# Patient Record
Sex: Male | Born: 1985 | Race: White | Hispanic: No | Marital: Single | Smoking: Never smoker
Health system: Western US, Academic
[De-identification: ages and names within clinical notes are randomized; demographics above are authoritative.]

## PROBLEM LIST (undated history)

## (undated) DIAGNOSIS — C9140 Hairy cell leukemia not having achieved remission: Secondary | ICD-10-CM

## (undated) HISTORY — DX: Hairy cell leukemia not having achieved remission (CMS-HCC): C91.40

---

## 2012-09-29 DIAGNOSIS — M62838 Other muscle spasm: Secondary | ICD-10-CM

## 2012-09-29 DIAGNOSIS — M7918 Myalgia, other site: Secondary | ICD-10-CM

## 2012-09-29 DIAGNOSIS — M545 Low back pain: Secondary | ICD-10-CM

## 2014-02-10 ENCOUNTER — Telehealth (HOSPITAL_BASED_OUTPATIENT_CLINIC_OR_DEPARTMENT_OTHER): Payer: Self-pay

## 2014-02-10 NOTE — Telephone Encounter (Addendum)
Elijah Davis's would like a consult with Dr.Jamieson. Patient to fax his current medical records to [phone].     91 pages of medical records received on 11.30.15 from [name]. Diagnosis of Refractory hairy cell leukemia. Records are from [facility]  [address]  [address] [phone]. All so records are from [place] [address], [phone] Fax [phone]. Records placed for scanning. Please note Elijah Davis's is requesting a consult with Dr.Jamieson.

## 2014-02-23 ENCOUNTER — Telehealth (HOSPITAL_BASED_OUTPATIENT_CLINIC_OR_DEPARTMENT_OTHER): Payer: Self-pay | Admitting: Nurse Practitioner

## 2014-02-23 NOTE — Telephone Encounter (Signed)
Elijah Davis's calling in inquering on the status of his case being reviewed. I let him know his case was reviewed, Dr.Jamieson did review his case however she does not see his dx. I advised Elijah Davis's that I will have the triage M.D N.P re look at his records, and phone him back.       91 pages of medical records received on 11.30.15 from Elijah Davis's. Diagnosis of Refractory hairy cell leukemia. Records are from [facility] [address] [address] [phone]. All so records are from [facility] [place] [address], [phone] Fax [phone]. Records have been scanned in.

## 2014-02-26 ENCOUNTER — Telehealth (HOSPITAL_BASED_OUTPATIENT_CLINIC_OR_DEPARTMENT_OTHER): Payer: Self-pay | Admitting: Internal Medicine

## 2014-02-26 NOTE — Telephone Encounter (Signed)
Mr. Elijah Davis calling in inquiring on the status of his case being reviewed. I let him know his case was reviewed, Dr. Oretha Ellis did review his case however she does not see his dx. I advised Elijah Davis's that I will have the triage M.D N.P re look at his records, and phone him back.       91 pages of medical records received on 11.30.15 from Elijah Davis. Diagnosis of Refractory hairy cell leukemia. Records are from [facility] [address], [place] [address] [phone]. All so records are from [facility] [place] [address], [phone] Fax [phone]. Records have been scanned in.

## 2014-03-01 ENCOUNTER — Telehealth (HOSPITAL_BASED_OUTPATIENT_CLINIC_OR_DEPARTMENT_OTHER): Payer: Self-pay

## 2014-03-01 NOTE — Telephone Encounter (Signed)
Adela Glimpse, MD [phone])    Sent: Sat February 27, 2014 5:04 PM     To: Alois Cliche; Tennis Ship, MD; Meredith Staggers, MD; Osa Craver, MD; Burnetta Sabin           Message      This patient is most appropriate for Iona Beard or possibly Bjorn Loser.     ----- Message -----     From: Alois Cliche     Sent: 02/26/2014 2:27 PM     To: Alois Cliche, Osa Craver, MD, *

## 2014-03-01 NOTE — Telephone Encounter (Signed)
Please review records in media for BMT.

## 2014-03-01 NOTE — Telephone Encounter (Signed)
Per heme physician - Dr. Tennis Ship - "   Refractory Hairy Cell can also be seen semi-urgently by BMT      1. Routed to BMT AA: - Kristeen Miss for new patient scheduling.

## 2014-03-01 NOTE — Telephone Encounter (Signed)
Phone call made to patient who recently moved to the area and needs to follow up with oncology services. Dx is Refractory hairy cell leukemia. Appt is scheduled for tomorrow 12/15 at 11AM. Medical records have already been received and scanned into media. NP packet has been sent to email address listed.

## 2014-03-02 ENCOUNTER — Ambulatory Visit: Payer: Managed Care, Other (non HMO) | Attending: Internal Medicine | Admitting: Internal Medicine

## 2014-03-02 ENCOUNTER — Encounter: Payer: Self-pay | Admitting: Hospital

## 2014-03-02 ENCOUNTER — Other Ambulatory Visit: Payer: Managed Care, Other (non HMO) | Attending: Hematology

## 2014-03-02 ENCOUNTER — Encounter (HOSPITAL_BASED_OUTPATIENT_CLINIC_OR_DEPARTMENT_OTHER): Payer: Self-pay | Admitting: Internal Medicine

## 2014-03-02 ENCOUNTER — Telehealth (HOSPITAL_BASED_OUTPATIENT_CLINIC_OR_DEPARTMENT_OTHER): Payer: Self-pay

## 2014-03-02 VITALS — BP 131/85 | HR 86 | Temp 98.5°F | Resp 17 | Ht 70.0 in | Wt 182.1 lb

## 2014-03-02 DIAGNOSIS — C9140 Hairy cell leukemia not having achieved remission: Principal | ICD-10-CM | POA: Insufficient documentation

## 2014-03-02 LAB — COMPREHENSIVE METABOLIC PANEL, BLOOD
ALT (SGPT): 25 U/L (ref 0–41)
AST (SGOT): 22 U/L (ref 0–40)
Albumin: 4.6 g/dL (ref 3.5–5.2)
Alkaline Phos: 66 U/L (ref 40–129)
Anion Gap: 12 mmol/L (ref 7–15)
BUN: 13 mg/dL (ref 6–20)
Bicarbonate: 29 mmol/L (ref 22–29)
Bilirubin, Tot: 0.24 mg/dL (ref <1.20)
Calcium: 10.3 mg/dL (ref 8.5–10.6)
Chloride: 101 mmol/L (ref 98–107)
Creatinine: 1.05 mg/dL (ref 0.67–1.17)
GFR: >60 mL/min
Glucose: 100 mg/dL (ref 70–115)
Potassium: 4.6 mmol/L (ref 3.5–5.1)
Sodium: 142 mmol/L (ref 136–145)
Total Protein: 7.1 g/dL (ref 6.0–8.0)

## 2014-03-02 LAB — CBC WITH DIFF, BLOOD
ANC-Automated: 3.8 10*3/uL (ref 1.6–7.0)
ANC-Instrument: 3.8 10*3/uL (ref 1.6–7.0)
Abs Eosinophils: 0.1 10*3/uL (ref 0.1–0.7)
Abs Lymphs: 1 10*3/uL (ref 0.8–3.1)
Abs Monos: 0.5 10*3/uL (ref 0.2–0.8)
Eosinophils: 2 % (ref 1–4)
Hct: 48.4 % (ref 40.0–50.0)
Hgb: 16.4 gm/dL (ref 13.7–17.5)
Lymphocytes: 19 % (ref 19–53)
MCH: 29.8 pg (ref 26.0–32.0)
MCHC: 33.9 % (ref 32.0–36.0)
MCV: 88 um3 (ref 79.0–95.0)
MPV: 10.8 fL (ref 9.4–12.4)
Monocytes: 9 % (ref 5–12)
Plt Count: 133 10*3/uL — ABNORMAL LOW (ref 140–370)
RBC: 5.5 10*6/uL (ref 4.60–6.10)
RDW: 13.8 % (ref 12.0–14.0)
Segs: 70 % (ref 34–71)
WBC: 5.4 10*3/uL (ref 4.0–10.0)

## 2014-03-02 MED ORDER — BUPROPION XL (DAILY) 300 MG OR TB24
150.00 mg | ORAL_TABLET | Freq: Every day | ORAL | Status: DC
Start: ? — End: 2015-12-09

## 2014-03-02 MED ORDER — NALTREXONE HCL 50 MG OR TABS
50.00 mg | ORAL_TABLET | Freq: Every day | ORAL | Status: DC
Start: ? — End: 2014-06-29

## 2014-03-02 MED ORDER — TRAZODONE HCL 100 MG OR TABS
100.00 mg | ORAL_TABLET | ORAL | Status: DC | PRN
Start: ? — End: 2015-12-09

## 2014-03-02 MED ORDER — QUETIAPINE FUMARATE 100 MG OR TABS
50.00 mg | ORAL_TABLET | Freq: Every day | ORAL | Status: DC
Start: ? — End: 2015-12-09

## 2014-03-02 NOTE — Telephone Encounter (Addendum)
Faxed request for Bone Marrow Biopsy slides (Case #[id number]) from 05/16/2012 to [employer] Fax [phone]

## 2014-03-02 NOTE — Progress Notes (Signed)
[facility]    INITIAL VISIT NOTE    Patient's name: Elijah Davis  Date of Birth: 1985-05-09  Address: [address]  [place] [address]  Phone: [phone] (home)     Date of visit: 03/02/2014    Reason for visit: Hairy Cell Leukemia    Elijah Davis is a 28 year old male with Hairy Cell Leukemia who is here to establish care.    The patient first noted the onset of acne like pustular facial skin lesions and fatigue while working in [place] in 2013. He then moved to [place] in early 2014 when he experienced fatigue, abdominal bloating and discomfort, weight loss, night sweats and bony pain. On 05/15/2012 he underwent an abdominal U/S that showed splenomegaly at 29 cm, mild hepatomegaly at 14.2 cm and nodules in the region of porta hepatis.. On 05/16/2012 his CBC showed WBC 2.1, H/H 7.4/22.8, MCV 101, Plt 23K, ANC 0.1, ALC 1.9. A CT of the C/A/P performed the same day showed massive splenomegaly measuring 33.6 cm. , confluent LAD in the gastrohepatic and hepatoduodenal ligaments measuring 4.5 x 7.5 cm and encasing the hepatic artery and protal vein and enlarged lymph nodes in the subcarinal space and the retroperitoneum measuring up to 1.4 cm. On 2/28 he underwent a bone marrow biopsy interpreted by [facility] that showed a hypercellular (95%) bone marrow demonstrating extensive 90% replacement by hairy cell leukemia with consequent reduction in trilineage hematopoesis. Flow cytometry from bone marrow aspirate shows CD11c positive B cell leukemia/lymphoma, 26% lambda restricted. The clonal B cells coexpressed CD103, CD25 and CD11c.     He was initially treated with Cladribine for 7 days. At that time he was also started on escalating doses of opioids for abdominal and bony pain. He felt better after the treatment and his abdominal pain and distention improved and he was able to go back to work. He continued to have bony pain. An U/S performed on 06/13/12 showed decrease in the size of  the spleen to 22 cm. A repeat bone marrow biopsy in the summer of 2014 showed by report partial response with 10% leukemic cells.    The patient quit his job and moved back to [facility] where his parents resided in October of 2014. There he established care with Dr. Pollie Meyer. At that time he had worsening bone pain and fatigue and was found to be pancytopenic (1.7> 15.6/45.5 <87) A bone marrow biopsy on 02/02/2013 showed a hypocellular marrrow (10-30%) involved by HCL at ~60%. He was then treated with rituximab x 8 from 02/16/2013 to 04/07/2012. On 05/29/2013 his CBC was 2.1>15.7/45.4<11 and he underwent a bone marrow biopsy that showed involvement by residual HCL that was now CD20 negative. Cytogenetics were normal. Apparently BRAF mutation(s) was detected by report although I do not have the original documentation.    On 06/26/2013 the patient was started on dabrafenib 177m bid. His CBC at that time was : 1.5>15.1<100 and his constitutional symptoms were getting worse. Within weeks of starting dabrafetinib the patient noted marked symptomatic improvement. On 08/13/2013 his CBC was 4.1>16.6<110. The only side effect that he noticed was joint pains that resolved after he discontinued the drug for 2-3 days.    On 10/26/2013 [facility] On 12/11/2013 he underwent a bone marrow biopsy that showed residual involvement by hairy cell leukemia (10-15% of marrow cellularity by IHC and 0.23% of all white cells by flow).  The cells were positive for CD20 (bright) and CD19 (bright), CD11c, CD25, CD103 and CD200 and negative ofr CD5, CD10 or CD38. BRAF exon 15 mutation analysis was negative for mutation. Dr Odis Hollingshead recommended at that time that he stops the dabrafenib with close follow up as the length of treatment has not been established, the long term side effects are unknown but there is definitely an increased incidence in non-melanoma skin cancers in patient treated with  BRAF inhibitors.     The patient moved to [facility] a few weeks ago to undergo rehab for his opioid addiction. He has now successfully completed that and feels really well. He has been exercising and his mood is normal. His skin lesions have resolved. He denies back pain, bone pain, abdominal distention or early satiety. He wants to look for a job in the [facility] area. He has now been off treatment for about a month and a half.    The patient denies fevers, chills, night sweats, sore throat, blurry vision, difficulty breathing, chest pain, abdominal pain, nausea, vomiting, diarrhea, dysuria, bleeding or easy bruising, neuropathy, depression or anxiety.  The rest of a comprehensive review of systems is negative.    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  He smoked e-cigarettes in the past for a short time, not now.  He doesn't drink alcohol  He had used marijuana and cocaine in the past.    Family History:  His parents are alive and well. He has 3 younger siblings (2 male and 1 male) who are A&W. He had a paternal aunt who died of breast [place] and his maternal granmother died of a brain tumor    Medications:    buPROPion (WELLBUTRIN) 300 MG Extended-Release tablet Take 300 mg by mouth daily.   naltrexone (DEPADE) 50 MG tablet Take 50 mg by mouth daily.   QUEtiapine (SEROQUEL) 100 MG tablet Take 100 mg by mouth 2 times daily.   traZODone (DESYREL) 100 MG tablet Take 100 mg by mouth nightly.       Allergies:  Review of patient's allergies indicates no known allergies.    Vital Signs:  BP 131/85 mmHg   Pulse 86   Temp(Src) 98.5 F (36.9 C) (Oral)   Resp 17   Ht $R'5\' 10"'nd$  (1.778 m)   Wt 82.6 kg (182 lb 1.6 oz)   BMI 26.13 kg/m2   SpO2 98%    Physical Exam:  General Impression: NAD, KPS is 100%  HEENT: PERRL, OP is moist and clear  Neck is supple with no palpable thyromegaly  Lymph nodes: There is no palpable lower cervical,  supraclavicular, axillary or groin LAD bilaterally  Lungs: clear to auscultation and percussion bilaterally  Heart: RRR, no m/r/g  Abdomen: Soft, NT, ND without palpable masses or hepatosplenomegaly  Ext: No LE edema  Neuro: A&Ox3, no gross abnormalities  Skin: no rashes. There is some scarring from his old rash on the R side of the face.    Labs:    Lab Results   Component Value Date    WBC 5.4 03/02/2014    RBC 5.50 03/02/2014    HGB 16.4 03/02/2014    HCT 48.4 03/02/2014    MCV 88.0 03/02/2014    MCHC 33.9 03/02/2014    RDW 13.8 03/02/2014    PLT 133 03/02/2014    MPV 10.8 03/02/2014    SEG 70  03/02/2014    LYMPHS 19 03/02/2014    MONOS 9 03/02/2014    EOS 2 03/02/2014       Assessment and Plan:    In summary Mr. Hersh is a 28 yo man with history of refractory hairy cell leukemia s/p treatment with cladribine, rituximab and dabrafenib. As noted by Dr. [facility]dis Holl[facility]head at [facility] there were a few atypical features about his presentation and history and namely his young age at diagnosis, the presence of bulky abdominal LAD and of a skin rash and his refractoriness to treatment. Off note the test for the BRAF mutation at his last marrow was negative, so one wonders what the mechanism for his impressive response to treatment was.     He now feels and looks completely normal and his counts have normalized with an exception of a very mild trhombocytopenia that is not much different compared to his last CBC in September as their lower limit of normal is 160. I would also agree with Dr. Era Skeen recommendation to continue holding the dabrafenib in the absence of an indication for treatment even though his last marrow showed still involvement by residual HCL. This recommendation was based on the fact that there is no data regarding prolonged treatment with BRAF inhibitors and there are definitely some serious concerns for their long term safety. In addition all the data in HCL is with vemurafenib rather than dabrafenib. Therefore  we will carefully monitor his disease with montly CBCs and flow cytometry of the peripheral blood periodically. If his counts were to drop or if he developed symptoms, we will need to proceed with a bone marrow biopsy but I do not see an indication presently. Rhylan is in complete agreement with this plan and knows to call if he develops new symptoms or has any concerns. He will return in one month for a scheduled visit. We also talked at length about the prognosis, pathogenesis and treatment of hairy cell leukemia, issues that he knows and understands really well.

## 2014-03-04 LAB — B CELL LYMPHOMA FLOW CYTOMETRY PANEL

## 2014-03-04 NOTE — Telephone Encounter (Signed)
Received slides from [facility] and forwarded to Pathology at [facility].

## 2014-03-05 ENCOUNTER — Other Ambulatory Visit
Admit: 2014-03-05 | Discharge: 2014-03-05 | Disposition: A | Discharge status: Home / Self Care | Payer: Managed Care, Other (non HMO) | Attending: Internal Medicine | Admitting: Internal Medicine

## 2014-03-05 DIAGNOSIS — C9140 Hairy cell leukemia not having achieved remission: Secondary | ICD-10-CM

## 2014-03-09 ENCOUNTER — Ambulatory Visit (HOSPITAL_BASED_OUTPATIENT_CLINIC_OR_DEPARTMENT_OTHER): Payer: Managed Care, Other (non HMO) | Admitting: Internal Medicine

## 2014-03-09 NOTE — Procedures (Signed)
OUTSIDE SLIDE REVIEW  Received from [facility], [place] [address]  "[id number]" collected on 05/16/2012  DIAGNOSIS:    BONE MARROW:  - Hairy cell leukemia involvement of a hypercellular marrow (see  interpretation)           - Moderately increased reticulin fibrosis  PERIPHERAL BLOOD (per CBC only):  - Pancytopenia    INTERPRETATION:  The hypercellular marrow contains small, mature leukemia/ lymphoma cells  that occupy over  90% of the total cellularity.  These leukemia/ lymphoma  cells have morphology consistent with hairy cell leukemia on the aspirate  smear.  Flow cytometry reveals these lymphoma/ leukemia cells have an  immunophenotype diagnostic of hairy cell leukemia (CD11c+, CD25+, CD103+).  The lymphoma/ leukemia cells also stain for Annexin A1, considered a  specific stain for hairy cell leukemia.    CLINICAL DIAGNOSIS:  28 y/o male.  HEMOGRAM (dated 05/15/2012):  WBC 2.1K/Cu.mm, RBC 2.77M/Cu.mm., HGB 7.4gm/dL, HCT 22.6%, MCV 101.FL, RDW  22.8%, and PLT 23.0K/Cu.mm. Absolute count: neutrophils 100/Cu.mm,  lymphocytes 1900/Cu.mm, monocytes 100/Cu.mm.    PERIPHERAL BLOOD SMEAR:  Not available.  ASPIRATE SMEAR:  Aspirate smears contain no spicules, thus insufficient material for review.  However, mononuclear cells with "hairy" cytoplasmic projections are seen  intermixed with red blood cells in the background.    TOUCH PREP:  Not available.    BIOPSY:  Sections of the bone marrow biopsy, which are adequate for evaluation, show  a 95% cellular marrow, hypercellular for age. Trilineage hematopoiesis is  markedly reduced due to marked interstitial infiltration of atypical  lymphoid cells. The atypical lymphoid cells show abundant cytoplasm and  oval nuclei with a "fried egg" appearance. Megakaryocytes are decreased  with normal morphology. No increase in blasts is identified.    CLOT SECTION:  Not available.  IRON STAIN:  Per outside report, iron stores are not identified in both  aspirate and  biopsy section. Of note, the aspirate is aspicular.    SPECIAL STAINS:  Kindly provided immunohistochemical stains were performed on the biopsy  section. The atypical lymphoid infiltrates are composed of CD20/PAX5  positive B-cells which co-express CD25, CD123 (small subset), Annexin A1  (dim), comprising 90% of the total cellularity. CD3 highlights scattered  T-cells in the background. Per outside report, CD34, CD117, and E-cadherin  highlights blasts, constituting less than 5% of the total cellularity.  CD117 also highlights scattered mast cells. CD138 highlights scattered  plasma cells. CD61 highlights a few megakaryocytes. CD71 highlights  decreased erythroid precursors.  Reticulin stain show moderate fibrosis.  FLOW CYTOMETRY ASSESSMENT:  Please refer to outside report.  FLOW CYTOMETRY RESULTS:  Per outside report, immunophenotypic analysis reveals CD11c-positive B-cell  leukemia/lymphoma, 26% lambda-restricted B-cells.  Please refer to outside report for further details.  CYTOGENETICS:  Per outside report, cytogenetic analysis shows no dividing cells.  Please refer to outside report for further details.    SPECIMEN(S) SUBMITTED:  Received from [employer], [place] [address] are 9 slides labeled  "[id number]" collected on 05/16/2012.  CONFIDENTIAL HEALTH INFORMATION: Health Care information is personal and  sensitive information. If it is being faxed to you it is done so under  appropriate authorization from the patient or under circumstances that do  not require patient authorization. You, the recipient, are obligated to  maintain it in a safe, secure and confidential manner. Re-disclosure  without additional patient consent or as permitted by law is prohibited.  Unauthorized re-disclosure or failure to maintain confidentiality could  subject you to penalties  described in federal and state law.  If you have  received this report or facsimile in error, please notify the [facility]  Department immediately and destroy the received document(s).    Material reviewed and Interpreted and  Report Electronically Signed by:  Lura Em M.D. [phone])  Attending Pathologist  03/09/14 14:49  Electronic Signature derived from a single  controlled access password

## 2014-03-30 ENCOUNTER — Other Ambulatory Visit: Payer: Managed Care, Other (non HMO) | Attending: Internal Medicine

## 2014-03-30 ENCOUNTER — Encounter (HOSPITAL_BASED_OUTPATIENT_CLINIC_OR_DEPARTMENT_OTHER): Payer: Self-pay | Admitting: Internal Medicine

## 2014-03-30 ENCOUNTER — Ambulatory Visit: Payer: Managed Care, Other (non HMO) | Attending: Internal Medicine | Admitting: Internal Medicine

## 2014-03-30 VITALS — BP 133/69 | HR 75 | Temp 98.5°F | Resp 16 | Ht 70.0 in | Wt 185.6 lb

## 2014-03-30 DIAGNOSIS — C9140 Hairy cell leukemia not having achieved remission: Principal | ICD-10-CM | POA: Insufficient documentation

## 2014-03-30 LAB — CBC WITH DIFF, BLOOD
ANC-Automated: 5.4 10*3/uL (ref 1.6–7.0)
ANC-Instrument: 5.4 10*3/uL (ref 1.6–7.0)
Abs Eosinophils: 0.1 10*3/uL (ref 0.1–0.7)
Abs Lymphs: 1.6 10*3/uL (ref 0.8–3.1)
Abs Monos: 0.4 10*3/uL (ref 0.2–0.8)
Eosinophils: 1 % (ref 1–4)
Hct: 48.7 % (ref 40.0–50.0)
Hgb: 16.6 gm/dL (ref 13.7–17.5)
Lymphocytes: 21 % (ref 19–53)
MCH: 29.8 pg (ref 26.0–32.0)
MCHC: 34.1 % (ref 32.0–36.0)
MCV: 87.4 um3 (ref 79.0–95.0)
MPV: 11.1 fL (ref 9.4–12.4)
Monocytes: 6 % (ref 5–12)
Plt Count: 146 10*3/uL (ref 140–370)
RBC: 5.57 10*6/uL (ref 4.60–6.10)
RDW: 13.2 % (ref 12.0–14.0)
Segs: 72 % — ABNORMAL HIGH (ref 34–71)
WBC: 7.5 10*3/uL (ref 4.0–10.0)

## 2014-03-30 LAB — COMPREHENSIVE METABOLIC PANEL, BLOOD
ALT (SGPT): 32 U/L (ref 0–41)
AST (SGOT): 44 U/L — ABNORMAL HIGH (ref 0–40)
Albumin: 4.8 g/dL (ref 3.5–5.2)
Alkaline Phos: 62 U/L (ref 40–129)
Anion Gap: 14 mmol/L (ref 7–15)
BUN: 22 mg/dL — ABNORMAL HIGH (ref 6–20)
Bicarbonate: 27 mmol/L (ref 22–29)
Bilirubin, Tot: 0.31 mg/dL (ref <1.20)
Calcium: 9.9 mg/dL (ref 8.5–10.6)
Chloride: 100 mmol/L (ref 98–107)
Creatinine: 1.15 mg/dL (ref 0.67–1.17)
GFR: >60 mL/min
Glucose: 101 mg/dL (ref 70–115)
Potassium: 5.1 mmol/L (ref 3.5–5.1)
Sodium: 141 mmol/L (ref 136–145)
Total Protein: 7.3 g/dL (ref 6.0–8.0)

## 2014-03-30 NOTE — Progress Notes (Signed)
[facility]    Patient's name: Elijah Davis  Date of Birth: 12/29/1985    Date of visit: 03/30/2014    Reason for visit: Scheduled follow up for Hairy Cell Leukemia    Elijah Davis is a 28 year old male with HCL currently in hematologic remission and off treatment who returns today for a scheduled follow up visit. For details of his initial presentation and prior treatment please refer to my note from 03/02/14. He continues to feel well. He's exercising. He has no complaints.    The patient denies fevers, chills, night sweats, sore throat, blurry vision, difficulty breathing, chest pain, abdominal pain, nausea, vomiting, diarrhea, dysuria, bleeding or easy bruising, neuropathy, depression or anxiety.  The rest of a comprehensive review of systems is negative.    Past Medical History:  He  has a past medical history of Hairy cell leukemia.    Social History:  He  reports that he has never smoked. He does not have any smokeless tobacco history on file. He reports that he does not drink alcohol or use illicit drugs.    Medications:    buPROPion (WELLBUTRIN) 300 MG Extended-Release tablet Take 300 mg by mouth daily.   naltrexone (DEPADE) 50 MG tablet Take 50 mg by mouth daily.   QUEtiapine (SEROQUEL) 100 MG tablet Take 100 mg by mouth 2 times daily.   traZODone (DESYREL) 100 MG tablet Take 100 mg by mouth nightly.         Allergies:  Review of patient's allergies indicates no known allergies.    Vital Signs:  BP 133/69 mmHg   Pulse 75   Temp(Src) 98.5 °F (36.9 °C) (Oral)   Resp 16   Ht 5' 10" (1.778 m)   Wt 84.171 kg (185 lb 9 oz)   BMI 26.63 kg/m2   SpO2 98%      Physical Exam:  General Impression: NAD, KPS is 100%  HEENT: PERRL, OP is moist and clear  Lymph nodes: There is no palpable lower cervical, supraclavicular, axillary or groin LAD bilaterally  Lungs: clear to auscultation and percussion bilaterally  Heart: RRR, no m/r/g  Abdomen: Soft, NT, ND without palpable masses or  hepatosplenomegaly  Ext: No LE edema  Neuro: A&Ox3, no gross abnormalities    Labs:  Results for orders placed or performed in visit on 03/30/14   CBC WITH ADIFF, BLOOD   Result Value Ref Range    WBC 7.5 4.0 - 10.0 1000/mm3    RBC 5.57 4.60 - 6.10 mill/mm3    Hgb 16.6 13.7 - 17.5 gm/dL    Hct 48.7 40.0 - 50.0 %    MCV 87.4 79.0 - 95.0 um3    MCH 29.8 26.0 - 32.0 pgm    MCHC 34.1 32.0 - 36.0 %    RDW 13.2 12.0 - 14.0 %    MPV 11.1 9.4 - 12.4 fL    Plt Count 146 140 - 370 1000/mm3    ANC-Instrument 5.4 1.6 - 7.0 1000/mm3    Segs 72 (H) 34 - 71 %    Lymphocytes 21 19 - 53 %    Monocytes 6 5 - 12 %    Eosinophils 1 <1-4 %    ANC-Automated 5.4 1.6 - 7.0 1000/mm3    Abs Lymphs 1.6 0.8 - 3.1 1000/mm3    Abs Monos 0.4 0.2 - 0.8 1000/mm3    Abs Eosinophils 0.1 <0.1-0.7 1000/mm3    Diff Type Automated        Flow cytometry of the PB on 03/02/14:  There is no evidence of hairy cell leukemia in the peripheral blood. Clinical correlation is advised.     Assessment and Plan:    In summary Elijah Davis is a 29 yo man with history of relapsed refractory Hairy Cell Leukemia ost recently treated with the BRAF inhibitor dabrafenib which was stopped in October. He continues to do really well clinically and his CBC from today and from last month are consistent with hematologic remission. His peripheral blood had no detectable hairy cells by flow. He was cleared by me to start looking for a job which is something that he wants to do. He will return for a follow up in 2-3 months but was instructed to call me in the interim with any new problems or concerns.

## 2014-06-28 ENCOUNTER — Encounter (HOSPITAL_BASED_OUTPATIENT_CLINIC_OR_DEPARTMENT_OTHER): Payer: Self-pay

## 2014-06-28 DIAGNOSIS — C9140 Hairy cell leukemia not having achieved remission: Principal | ICD-10-CM

## 2014-06-29 ENCOUNTER — Ambulatory Visit: Payer: Managed Care, Other (non HMO) | Attending: Internal Medicine | Admitting: Internal Medicine

## 2014-06-29 ENCOUNTER — Other Ambulatory Visit: Payer: Managed Care, Other (non HMO) | Attending: Internal Medicine

## 2014-06-29 VITALS — BP 122/69 | HR 71 | Temp 98.8°F | Resp 16 | Ht 70.0 in | Wt 180.9 lb

## 2014-06-29 DIAGNOSIS — C9140 Hairy cell leukemia not having achieved remission: Principal | ICD-10-CM | POA: Insufficient documentation

## 2014-06-29 DIAGNOSIS — D696 Thrombocytopenia, unspecified: Secondary | ICD-10-CM | POA: Insufficient documentation

## 2014-06-29 LAB — COMPREHENSIVE METABOLIC PANEL, BLOOD
ALT (SGPT): 28 U/L (ref 0–41)
AST (SGOT): 26 U/L (ref 0–40)
Albumin: 4.7 g/dL (ref 3.5–5.2)
Alkaline Phos: 71 U/L (ref 40–129)
Anion Gap: 15 mmol/L (ref 7–15)
BUN: 17 mg/dL (ref 6–20)
Bicarbonate: 25 mmol/L (ref 22–29)
Bilirubin, Tot: 0.3 mg/dL (ref <1.20)
Calcium: 9.4 mg/dL (ref 8.5–10.6)
Chloride: 101 mmol/L (ref 98–107)
Creatinine: 1.04 mg/dL (ref 0.67–1.17)
GFR: >60 mL/min
Glucose: 95 mg/dL (ref 70–115)
Potassium: 4.3 mmol/L (ref 3.5–5.1)
Sodium: 141 mmol/L (ref 136–145)
Total Protein: 7.2 g/dL (ref 6.0–8.0)

## 2014-06-29 LAB — CBC WITH DIFF, BLOOD
ANC-Automated: 3.7 10*3/uL (ref 1.6–7.0)
Abs Eosinophils: 0.1 10*3/uL (ref 0.1–0.7)
Abs Lymphs: 1.1 10*3/uL (ref 0.8–3.1)
Abs Monos: 0.3 10*3/uL (ref 0.2–0.8)
Eosinophils: 2 % (ref 1–4)
Hct: 48.7 % (ref 40.0–50.0)
Hgb: 16.9 gm/dL (ref 13.7–17.5)
Lymphocytes: 22 % (ref 19–53)
MCH: 30.3 pg (ref 26.0–32.0)
MCHC: 34.7 % (ref 32.0–36.0)
MCV: 87.4 um3 (ref 79.0–95.0)
MPV: 11 fL (ref 9.4–12.4)
Monocytes: 5 % (ref 5–12)
Plt Count: 121 10*3/uL — ABNORMAL LOW (ref 140–370)
RBC: 5.57 10*6/uL (ref 4.60–6.10)
RDW: 12.6 % (ref 12.0–14.0)
Segs: 71 % (ref 34–71)
WBC: 5.2 10*3/uL (ref 4.0–10.0)

## 2014-06-29 NOTE — Progress Notes (Signed)
[place]    Patient's name: [name]  Date of Birth: [birth year]    Date of visit: 06/29/2014      Reason for visit: Scheduled follow up for Hairy Cell Leukemia    Elijah Davis is a 29 year old male with HCL currently in hematologic remission and off treatment who returns today for a scheduled follow up visit. For details of his initial presentation and prior treatment please refer to my note from 03/02/14. He continues to feel well. He's exercising. He has no complaints.    The patient denies fevers, chills, night sweats, sore throat, blurry vision, difficulty breathing, chest pain, abdominal pain, nausea, vomiting, diarrhea, dysuria, bleeding or easy bruising, neuropathy, depression or anxiety.  The rest of a comprehensive review of systems is negative.    Past Medical History:  He  has a past medical history of Hairy cell leukemia.    Social History:  He  reports that he has never smoked. He has quit using smokeless tobacco. He reports that he does not drink alcohol or use illicit drugs.    Medications:    buPROPion (WELLBUTRIN) 300 MG Extended-Release tablet Take 150 mg by mouth daily.     QUEtiapine (SEROQUEL) 100 MG tablet Take 50 mg by mouth daily.     traZODone (DESYREL) 100 MG tablet Take 100 mg by mouth as needed.           Allergies:  Review of patient's allergies indicates no known allergies.    Vital Signs:  BP 122/69 mmHg   Pulse 71   Temp(Src) 98.8 F (37.1 C) (Oral)   Resp 16   Ht $R'5\' 10"'Vu$  (1.778 m)   Wt 82.056 kg (180 lb 14.4 oz)   BMI 25.96 kg/m2   SpO2 99%      Physical Exam:  General Impression: NAD, KPS is 100%  HEENT: PERRL, OP is moist and clear  Lymph nodes: There is no palpable lower cervical, supraclavicular, axillary or groin LAD bilaterally  Lungs: clear to auscultation and percussion bilaterally  Heart: RRR, no m/r/g  Abdomen: Soft, NT, ND without palpable masses or hepatosplenomegaly  Ext: No LE edema  Neuro: A&Ox3, no gross abnormalities    Labs:  Results for  orders placed or performed in visit on 06/29/14   CBC WITH ADIFF, BLOOD   Result Value Ref Range    WBC 5.2 4.0 - 10.0 1000/mm3    RBC 5.57 4.60 - 6.10 mill/mm3    Hgb 16.9 13.7 - 17.5 gm/dL    Hct 48.7 40.0 - 50.0 %    MCV 87.4 79.0 - 95.0 um3    MCH 30.3 26.0 - 32.0 pgm    MCHC 34.7 32.0 - 36.0 %    RDW 12.6 12.0 - 14.0 %    MPV 11.0 9.4 - 12.4 fL    Plt Count 121 (L) 140 - 370 1000/mm3    ANC-Instrument 3.7 1.6 - 7.0 1000/mm3    Segs 71 34 - 71 %    Lymphocytes 22 19 - 53 %    Monocytes 5 5 - 12 %    Eosinophils 2 <1-4 %    ANC-Automated 3.7 1.6 - 7.0 1000/mm3    Abs Lymphs 1.1 0.8 - 3.1 1000/mm3    Abs Monos 0.3 0.2 - 0.8 1000/mm3    Abs Eosinophils 0.1 <0.1-0.7 1000/mm3    Diff Type Automated      Flow cytometry of the PB on 03/02/14:  There is  no evidence of hairy cell leukemia in the peripheral blood. Clinical correlation is advised.     Assessment and Plan:    In summary Elijah Davis is a 29 yo man with history of relapsed refractory Hairy Cell Leukemia most recently treated with the BRAF inhibitor dabrafenib which was stopped in October 2015. He continues to do really well clinically and his CBC has been stable overall with the exception of mild thrombocytopenia. Because of that, we'll repeat his flow in the PB today and if it looks OK, he will return for a follow up in 3-4 months. He was instructed to call me in the interim with any new problems or concerns.

## 2014-06-30 LAB — B CELL LYMPHOMA FLOW CYTOMETRY PANEL

## 2014-11-23 ENCOUNTER — Ambulatory Visit (HOSPITAL_BASED_OUTPATIENT_CLINIC_OR_DEPARTMENT_OTHER): Payer: Managed Care, Other (non HMO) | Admitting: Internal Medicine

## 2014-11-29 NOTE — Progress Notes (Signed)
No show

## 2015-01-04 ENCOUNTER — Ambulatory Visit: Payer: Managed Care, Other (non HMO) | Attending: Internal Medicine | Admitting: Internal Medicine

## 2015-01-04 ENCOUNTER — Other Ambulatory Visit (HOSPITAL_BASED_OUTPATIENT_CLINIC_OR_DEPARTMENT_OTHER): Payer: Managed Care, Other (non HMO)

## 2015-01-04 VITALS — BP 147/82 | HR 79 | Temp 98.4°F | Resp 17 | Ht 70.0 in | Wt 181.4 lb

## 2015-01-04 DIAGNOSIS — C9140 Hairy cell leukemia not having achieved remission: Secondary | ICD-10-CM | POA: Insufficient documentation

## 2015-01-04 DIAGNOSIS — C9141 Hairy cell leukemia, in remission: Principal | ICD-10-CM | POA: Insufficient documentation

## 2015-01-04 LAB — COMPREHENSIVE METABOLIC PANEL, BLOOD
ALT (SGPT): 17 U/L (ref 0–41)
AST (SGOT): 22 U/L (ref 0–40)
Albumin: 4.7 g/dL (ref 3.5–5.2)
Alkaline Phos: 54 U/L (ref 40–129)
Anion Gap: 13 mmol/L (ref 7–15)
BUN: 19 mg/dL (ref 6–20)
Bicarbonate: 27 mmol/L (ref 22–29)
Bilirubin, Tot: 0.62 mg/dL (ref <1.20)
Calcium: 10 mg/dL (ref 8.5–10.6)
Chloride: 100 mmol/L (ref 98–107)
Creatinine: 1.05 mg/dL (ref 0.67–1.17)
GFR: >60 mL/min
Glucose: 87 mg/dL (ref 70–99)
Potassium: 4.8 mmol/L (ref 3.5–5.1)
Sodium: 140 mmol/L (ref 136–145)
Total Protein: 7.4 g/dL (ref 6.0–8.0)

## 2015-01-04 LAB — CBC WITH DIFF, BLOOD
ANC-Automated: 4.3 10*3/uL (ref 1.6–7.0)
ANC-Instrument: 4.3 10*3/uL (ref 1.6–7.0)
Abs Eosinophils: 0.1 10*3/uL (ref 0.1–0.5)
Abs Lymphs: 1.5 10*3/uL (ref 0.8–3.1)
Abs Monos: 0.2 10*3/uL (ref 0.2–0.8)
Eosinophils: 2 %
Hct: 48.3 % (ref 40.0–50.0)
Hgb: 16.7 gm/dL (ref 13.7–17.5)
Lymphocytes: 24 %
MCH: 31.2 pg (ref 26.0–32.0)
MCHC: 34.6 % (ref 32.0–36.0)
MCV: 90.1 um3 (ref 79.0–95.0)
MPV: 11.1 fL (ref 9.4–12.4)
Monocytes: 3 %
Plt Count: 135 10*3/uL — ABNORMAL LOW (ref 140–370)
RBC: 5.36 10*6/uL (ref 4.60–6.10)
RDW: 13 % (ref 12.0–14.0)
Segs: 70 %
WBC: 6.2 10*3/uL (ref 4.0–10.0)

## 2015-01-04 LAB — LDH, BLOOD: LDH: 177 U/L — ABNORMAL HIGH (ref 25–175)

## 2015-01-04 NOTE — Progress Notes (Signed)
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 For details of his initial presentation and prior treatment please refer to my note from 03/02/14. He continues to feel well. He's exercising. He hiked the [place] all the way to [place]. He just returned from his trip that lasted a few months. 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    The patient denies fevers, chills, night sweats, sore throat, blurry vision, difficulty breathing, chest pain, abdominal pain, nausea, vomiting, diarrhea, dysuria, bleeding or easy bruising, neuropathy, depression or anxiety.    The rest of a comprehensive review of systems is negative.    Past Medical History:  He  has a past medical history of Hairy cell leukemia.    Social History:  He  reports that he has never smoked. He has quit using smokeless tobacco. He reports that he does not drink alcohol or use illicit drugs.    Medications:    buPROPion (WELLBUTRIN) 300 MG Extended-Release tablet Take 150 mg by mouth daily.     QUEtiapine (SEROQUEL) 100 MG tablet Take 50 mg by mouth daily.     traZODone (DESYREL) 100 MG tablet Take 100 mg by mouth as needed.           Allergies:  Review of patient's allergies indicates no known allergies.    Vital Signs:  BP 147/82  Pulse 79  Temp 98.4 F (36.9 C) (Oral)  Resp 17  Ht _0  (1.778 m)  Wt 82.3 kg (181 lb 7 oz)  SpO2 98%  BMI 26.03 kg/m2      Physical Exam:  General Impression: NAD, KPS is 100%  HEENT: PERRL, OP is moist and clear  Lymph nodes: There is no palpable lower cervical, supraclavicular, axillary or groin LAD bilaterally  Lungs: clear to auscultation and percussion bilaterally  Heart: RRR, no m/r/g  Abdomen: Soft, NT, ND without palpable masses or  hepatosplenomegaly  Ext: No LE edema  Neuro: A&Ox3, no gross abnormalities    Labs:  Results for orders placed or performed in visit on 01/04/15   CBC WITH ADIFF, BLOOD   Result Value Ref Range    WBC 6.2 4.0 - 10.0 1000/mm3    RBC 5.36 4.60 - 6.10 mill/mm3    Hgb 16.7 13.7 - 17.5 gm/dL    Hct 48.3 40.0 - 50.0 %    MCV 90.1 79.0 - 95.0 um3    MCH 31.2 26.0 - 32.0 pgm    MCHC 34.6 32.0 - 36.0 %    RDW 13.0 12.0 - 14.0 %    MPV 11.1 9.4 - 12.4 fL    Plt Count 135 (L) 140 - 370 1000/mm3    ANC-Instrument 4.3 1.6 - 7.0 1000/mm3    Diff Type Automated     Segs 70 %    Lymphocytes 24 %    Monocytes 3 %    Eosinophils 2 %    ANC-Automated 4.3 1.6 - 7.0 1000/mm3    Abs Lymphs 1.5 0.8 - 3.1 1000/mm3    Abs Monos 0.2 0.2 - 0.8 1000/mm3    Abs Eosinophils 0.1 <0.1 - 0.5 1000/mm3   COMPREHENSIVE METABOLIC PANEL, BLOOD  Result Value Ref Range    Glucose 87 70 - 99 mg/dL    BUN 19 6 - 20 mg/dL    Creatinine 1.05 0.67 - 1.17 mg/dL    GFR >60 mL/min    Sodium 140 136 - 145 mmol/L    Potassium 4.8 3.5 - 5.1 mmol/L    Chloride 100 98 - 107 mmol/L    Bicarbonate 27 22 - 29 mmol/L    Anion Gap 13 7 - 15 mmol/L    Calcium 10.0 8.5 - 10.6 mg/dL    Total Protein 7.4 6.0 - 8.0 g/dL    Albumin 4.7 3.5 - 5.2 g/dL    Bilirubin, Tot 0.62 <1.20 mg/dL    AST (SGOT) 22 0 - 40 U/L    ALT (SGPT) 17 0 - 41 U/L    Alkaline Phos 54 40 - 129 U/L   LDH, BLOOD   Result Value Ref Range    LDH 177 (H) 25 - 175 U/L     Flow cytometry of the PB on 06/28/13:  There is no evidence of hairy cell leukemia in the peripheral blood. Clinical correlation is advised.     Assessment and Plan:    In summary Mr Molstad is a 29 yo man with history of relapsed refractory Hairy Cell Leukemia most recently treated with the BRAF inhibitor dabrafenib which was stopped in October 2015. He continues to do really well clinically and his CBC has been stable overall with the exception of mild thrombocytopenia. Because of that, we'll repeat his flow in the PB today and if it looks  OK, he will return for a follow up in 3-4 months. He was instructed to call me in the interim with any new problems or concerns.

## 2015-01-04 NOTE — Patient Instructions (Signed)
PLAN  1)  Return to clinic in   2) Plan to get an influenza vaccination ( through PCP or your local pharmacy) or at [facility] THURSDAY MORNING from 9a to 12p by APPOINTMENT only. Please stop by pharmacy or call us at [phone] to make appointment. No walk-ins.                 CASE MANAGER: Mandy Kj Imbert, RN  [facility], [address], [place] [address]      Phone # [phone] (For Non-urgent matters only)  After office hours MD on call line: [phone]. Ask to speak with BMT on call doctor.  Emergencies - please call [phone]     Fax # [phone]      Call [phone] to schedule or reschedule oncology clinic follow up appointment.   

## 2015-01-06 LAB — B CELL LYMPHOMA FLOW CYTOMETRY PANEL

## 2015-01-06 LAB — BLOOD SMEAR ON CELLAVISION

## 2015-11-29 ENCOUNTER — Telehealth (HOSPITAL_BASED_OUTPATIENT_CLINIC_OR_DEPARTMENT_OTHER): Payer: Self-pay

## 2015-11-29 ENCOUNTER — Encounter (HOSPITAL_BASED_OUTPATIENT_CLINIC_OR_DEPARTMENT_OTHER): Payer: Self-pay | Admitting: Internal Medicine

## 2015-11-29 DIAGNOSIS — C9141 Hairy cell leukemia, in remission: Principal | ICD-10-CM

## 2015-11-29 NOTE — Telephone Encounter (Signed)
Call pt to schedule  lab appointment.  Inquiry forwarded to Lab team.  Patient advised call will be returned.  Patient can be reached at phone [phone].  Pt wants an appt for tomorrow or the next day.

## 2015-11-29 NOTE — Telephone Encounter (Signed)
Patient scheduled Thurs 9/14 @ 0815. Awaiting lab orders from MD. Patient aware.

## 2015-12-01 ENCOUNTER — Other Ambulatory Visit
Admission: RE | Admit: 2015-12-01 | Discharge: 2015-12-02 | Disposition: A | Discharge status: Home / Self Care | Payer: BC Managed Care – PPO | Attending: Internal Medicine | Admitting: Internal Medicine

## 2015-12-01 DIAGNOSIS — C9141 Hairy cell leukemia, in remission: Secondary | ICD-10-CM

## 2015-12-01 LAB — CBC WITH DIFF, BLOOD
ANC-Automated: 1.3 10*3/uL — ABNORMAL LOW (ref 1.6–7.0)
ANC-Instrument: 1.3 10*3/uL — ABNORMAL LOW (ref 1.6–7.0)
Abs Eosinophils: 0.1 10*3/uL (ref 0.1–0.5)
Abs Lymphs: 1.1 10*3/uL (ref 0.8–3.1)
Abs Monos: 0.1 10*3/uL — ABNORMAL LOW (ref 0.2–0.8)
Eosinophils: 3 %
Hct: 46.8 % (ref 40.0–50.0)
Hgb: 16 gm/dL (ref 13.7–17.5)
Lymphocytes: 42 %
MCH: 30.2 pg (ref 26.0–32.0)
MCHC: 34.2 g/dL (ref 32.0–36.0)
MCV: 88.3 um3 (ref 79.0–95.0)
MPV: 9.9 fL (ref 9.4–12.4)
Monocytes: 4 %
Plt Count: 113 10*3/uL — ABNORMAL LOW (ref 140–370)
RBC: 5.3 10*6/uL (ref 4.60–6.10)
RDW: 14.3 % — ABNORMAL HIGH (ref 12.0–14.0)
Segs: 52 %
WBC: 2.6 10*3/uL — ABNORMAL LOW (ref 4.0–10.0)

## 2015-12-01 LAB — COMPREHENSIVE METABOLIC PANEL, BLOOD
ALT (SGPT): 12 U/L (ref 0–41)
AST (SGOT): 18 U/L (ref 0–40)
Albumin: 4.4 g/dL (ref 3.5–5.2)
Alkaline Phos: 41 U/L (ref 40–129)
Anion Gap: 13 mmol/L (ref 7–15)
BUN: 10 mg/dL (ref 6–20)
Bicarbonate: 27 mmol/L (ref 22–29)
Bilirubin, Tot: 0.49 mg/dL (ref <1.2)
Calcium: 9.4 mg/dL (ref 8.5–10.6)
Chloride: 104 mmol/L (ref 98–107)
Creatinine: 1.02 mg/dL (ref 0.67–1.17)
GFR: >60 mL/min
Glucose: 98 mg/dL (ref 70–99)
Potassium: 4.8 mmol/L (ref 3.5–5.1)
Sodium: 144 mmol/L (ref 136–145)
Total Protein: 6.8 g/dL (ref 6.0–8.0)

## 2015-12-01 LAB — LDH, BLOOD: LDH: 154 U/L (ref 25–175)

## 2015-12-01 LAB — TESTOSTERONE (MALE), BLOOD: Testosterone (Male): 3.46 ng/mL (ref 2.80–8.00)

## 2015-12-01 LAB — TSH, BLOOD: TSH: 1.04 u[IU]/mL (ref 0.27–4.20)

## 2015-12-05 LAB — B CELL LYMPHOMA FLOW CYTOMETRY PANEL

## 2015-12-09 ENCOUNTER — Ambulatory Visit: Payer: BC Managed Care – PPO | Attending: Internal Medicine | Admitting: Internal Medicine

## 2015-12-09 DIAGNOSIS — D696 Thrombocytopenia, unspecified: Secondary | ICD-10-CM | POA: Insufficient documentation

## 2015-12-09 DIAGNOSIS — C9141 Hairy cell leukemia, in remission: Principal | ICD-10-CM | POA: Insufficient documentation

## 2015-12-10 NOTE — Progress Notes (Signed)
[place]    Patient's name: Elijah Davis  Date of Birth: 01/02/1986    Date of visit: 12/09/2015    Reason for visit: Follow up for Hairy Cell Leukemia    Elijah Davis is a 30 year old male with HCL currently off treatment who returns today for a scheduled follow up visit. For details of his initial presentation and prior treatment please refer to my note from 03/02/14.     He continues to feel well. He's exercising and eating a healthy diet. He's working at a [employer] firm now. He hasn't had any recent infections aside from an occasional self-limited URI. Appetite has been good and weight has been stable. Not currently taking any medications aside from some OTC supplements and vitamins.    The patient denies fevers, chills, night sweats, sore throat, blurry vision, difficulty breathing, chest pain, abdominal pain, nausea, vomiting, diarrhea, dysuria, bleeding or easy bruising, neuropathy, depression or anxiety.    The rest of a comprehensive review of systems is negative.    Past Medical History:  He  has a past medical history of Hairy cell leukemia.    Social History:  He  reports that he has never smoked. He has quit using smokeless tobacco. He reports that he does not drink alcohol or use illicit drugs.    Medications:    No current outpatient prescriptions on file.      Allergies:  Review of patient's allergies indicates no known allergies.    Vital Signs:  There were no vitals taken for this visit.      Physical Exam:  General Impression: NAD, KPS is 100%  HEENT: PERRL, OP is moist and clear  Lymph nodes: There is no palpable lower cervical, supraclavicular, axillary or groin LAD bilaterally  Lungs: clear to auscultation and percussion bilaterally  Heart: RRR, no m/r/g  Abdomen: Soft, NT, ND without palpable masses or hepatosplenomegaly  Ext: No LE edema  Neuro: A&Ox3, no gross abnormalities    Labs:  Results for orders placed or performed during the hospital encounter of  12/01/15   CBC WITH ADIFF, BLOOD   Result Value Ref Range    WBC 2.6 (L) 4.0 - 10.0 1000/mm3    RBC 5.30 4.60 - 6.10 mill/mm3    Hgb 16.0 13.7 - 17.5 gm/dL    Hct 46.8 40.0 - 50.0 %    MCV 88.3 79.0 - 95.0 um3    MCH 30.2 26.0 - 32.0 pgm    MCHC 34.2 32.0 - 36.0 g/dL    RDW 14.3 (H) 12.0 - 14.0 %    MPV 9.9 9.4 - 12.4 fL    Plt Count 113 (L) 140 - 370 1000/mm3    ANC-Instrument 1.3 (L) 1.6 - 7.0 1000/mm3    Segs 52 %    Lymphocytes 42 %    Monocytes 4 %    Eosinophils 3 %    ANC-Automated 1.3 (L) 1.6 - 7.0 1000/mm3    Abs Lymphs 1.1 0.8 - 3.1 1000/mm3    Abs Monos 0.1 (L) 0.2 - 0.8 1000/mm3    Abs Eosinophils 0.1 <0.1 - 0.5 1000/mm3    Diff Type Automated    COMPREHENSIVE METABOLIC PANEL, BLOOD   Result Value Ref Range    Glucose 98 70 - 99 mg/dL    BUN 10 6 - 20 mg/dL    Creatinine 1.02 0.67 - 1.17 mg/dL    GFR >60 mL/min    Sodium 144 136 -  145 mmol/L    Potassium 4.8 3.5 - 5.1 mmol/L    Chloride 104 98 - 107 mmol/L    Bicarbonate 27 22 - 29 mmol/L    Anion Gap 13 7 - 15 mmol/L    Calcium 9.4 8.5 - 10.6 mg/dL    Total Protein 6.8 6.0 - 8.0 g/dL    Albumin 4.4 3.5 - 5.2 g/dL    Bilirubin, Tot 0.49 <1.2 mg/dL    AST (SGOT) 18 0 - 40 U/L    ALT (SGPT) 12 0 - 41 U/L    Alkaline Phos 41 40 - 129 U/L   LDH, BLOOD   Result Value Ref Range    LDH 154 25 - 175 U/L   TSH, BLOOD   Result Value Ref Range    TSH 1.04 0.27 - 4.20 uIU/mL   TESTOSTERONE (MALE), BLOOD   Result Value Ref Range    Testosterone (Male) 3.46 2.80 - 8.00 ng/mL   B CELL LYMPHOMA FLOW CYTOMETRY PANEL   Result Value Ref Range    Flow PDF Report       TO VIEW PDF OF REPORT CLICK "VIEW IMAGE" LINK BELOW UNDER LINKED DOCUMENTS     B Cell Lymphoma Flow Cytometry Result                            [facility] FLOW CYTOMETRY REPORT                                    Specimen Type: Peripheral Blood            SoftFlow Order #: [id number]              INTERPRETATION:                                                                 0.6% of the total nucleated cells are  aberrant, clonal B-cells consistent with  hairy cell leukemia, post treatment by history. Correlation with morphology is  necessary for further interpretation.                                              CLINICAL HISTORY:                                                               29 year old male with HCL (lambda) currently in hematologic remission and off   treatment.                                                                           FLOW CYTOMETRIC ASSESSMENT:                                                     The sample for the current flow cytometry analysis was received 12/01/2015 and  stained on 12/02/2015.                                                          Flow cytometric analysis was  performed on a minimum of [id number] cells for each     antibody.                                                                       The lysed peripheral blood was stained with antibodies directed against the     following antigens:CD2, CD3, CD4, CD5, CD7, CD8, CD10, CD11c, CD19, CD20,       CD23, CD25, CD30, CD38, CD45, CD79b, CD103, cKappa, cLambda, FMC7, sKappa,      sLambda.                                                                           FLOW CYTOMETRY RESULTS:                                                         0.6% of the total cells recovered are medium-sized with low complexity and an   aberrant, clonal B-lymphocyte immunophenotype. These clonal B-cells are         positive for CD11c, CD19, CD20, CD25, CD79b, CD103(dim), and lambda light       chain The clonal B-cells are NEGATIVE for CD5, CD10, and kappa immunoglobulin   light chain Of the total cells: 91% are leukocytes (CD45+).                     25% are T-lymphocytes (CD3+, CD5+) with CD4:CD8 ratio of 1.2:1.                  3% are polyclonal B lymphocytes (CD19+, CD20+) with a kappa to lambda           immunoglobulin light chain ratio of 1.5:1.                                         This test was developed and its performance  characteristics determined by the  [facility] at [address], [address] [place], [place] [address]. It has not been cleared or          approved by the [place] Food and Drug Administration. FDA does not require this      test to go through premarket FDA review. This test is used for clinical         purposes. It should not be regarded as investigational or for research. This    laboratory is certified under the Clinical Laboratory Improvement Amendments    (CLIA) as qualified to perform high complexity clinical laboratory testing.                 Electronically signed by:                                                       Lura Em, M.D., Attending Pathologist                                12/05/2015  5:12 PM                                                               Electronic signature derived from a single controlled access password           Assessment and Plan:    In summary Elijah Davis is a 30 yo man with history of relapsed refractory Hairy Cell Leukemia most recently treated with the BRAF inhibitor dabrafenib which was stopped in October 2015. He continues to do really well clinically and is asymptomatic. His most recent CBC is somewhat concerning for white count of 2.6 and ANC of 1.3. His hgb is normal at 16.0 and he continues to have mild thrombocytopenia at 113 which is down slightly from previous. His PB flow cytometry showed an aberrant B cell population comprising 0.6% of total cells consistent with his HCL.  Plan will be to repeat labs in 1-2 months. He will then follow up in clinic some time afterwards. He was instructed to call us in the interim with any new problems or concerns.     Discussed with Dr. Christen Butter MD PGY4 fellow      Addendum:  The patient was seen, examined and discussed with Dr. Tessa Lerner on 12/09/15. I agree with his note.

## 2016-02-10 ENCOUNTER — Other Ambulatory Visit
Admission: RE | Admit: 2016-02-10 | Discharge: 2016-02-10 | Disposition: A | Discharge status: Home / Self Care | Payer: BC Managed Care – PPO | Attending: Internal Medicine | Admitting: Internal Medicine

## 2016-02-10 DIAGNOSIS — C9141 Hairy cell leukemia, in remission: Secondary | ICD-10-CM

## 2016-02-10 LAB — CBC WITH DIFF, BLOOD
ANC-Automated: 1.3 10*3/uL — ABNORMAL LOW (ref 1.6–7.0)
ANC-Instrument: 1.3 10*3/uL — ABNORMAL LOW (ref 1.6–7.0)
Abs Eosinophils: 0.1 10*3/uL (ref 0.1–0.5)
Abs Lymphs: 1.1 10*3/uL (ref 0.8–3.1)
Abs Monos: 0.1 10*3/uL — ABNORMAL LOW (ref 0.2–0.8)
Eosinophils: 2 %
Hct: 43.6 % (ref 40.0–50.0)
Hgb: 14.9 gm/dL (ref 13.7–17.5)
Lymphocytes: 43 %
MCH: 29.8 pg (ref 26.0–32.0)
MCHC: 34.2 g/dL (ref 32.0–36.0)
MCV: 87.2 um3 (ref 79.0–95.0)
MPV: 9.8 fL (ref 9.4–12.4)
Monocytes: 4 %
Plt Count: 99 10*3/uL — ABNORMAL LOW (ref 140–370)
RBC: 5 10*6/uL (ref 4.60–6.10)
RDW: 14.3 % — ABNORMAL HIGH (ref 12.0–14.0)
Segs: 51 %
WBC: 2.6 10*3/uL — ABNORMAL LOW (ref 4.0–10.0)

## 2016-02-10 LAB — COMPREHENSIVE METABOLIC PANEL, BLOOD
ALT (SGPT): 16 U/L (ref 0–41)
AST (SGOT): 22 U/L (ref 0–40)
Albumin: 4.3 g/dL (ref 3.5–5.2)
Alkaline Phos: 43 U/L (ref 40–129)
Anion Gap: 15 mmol/L (ref 7–15)
BUN: 10 mg/dL (ref 6–20)
Bicarbonate: 24 mmol/L (ref 22–29)
Bilirubin, Tot: 0.31 mg/dL (ref <1.2)
Calcium: 8.5 mg/dL (ref 8.5–10.6)
Chloride: 103 mmol/L (ref 98–107)
Creatinine: 0.93 mg/dL (ref 0.67–1.17)
GFR: >60 mL/min
Glucose: 130 mg/dL — ABNORMAL HIGH (ref 70–99)
Potassium: 4 mmol/L (ref 3.5–5.1)
Sodium: 142 mmol/L (ref 136–145)
Total Protein: 6.5 g/dL (ref 6.0–8.0)

## 2016-02-10 LAB — LDH, BLOOD: LDH: 153 U/L (ref 25–175)

## 2016-02-14 ENCOUNTER — Encounter (HOSPITAL_BASED_OUTPATIENT_CLINIC_OR_DEPARTMENT_OTHER): Payer: Self-pay | Admitting: Internal Medicine

## 2016-02-14 DIAGNOSIS — C9141 Hairy cell leukemia, in remission: Principal | ICD-10-CM

## 2016-02-16 ENCOUNTER — Encounter (HOSPITAL_BASED_OUTPATIENT_CLINIC_OR_DEPARTMENT_OTHER): Payer: Self-pay | Admitting: Internal Medicine

## 2016-02-16 LAB — B CELL LYMPHOMA FLOW CYTOMETRY PANEL

## 2016-03-28 ENCOUNTER — Other Ambulatory Visit
Admission: RE | Admit: 2016-03-28 | Discharge: 2016-03-28 | Disposition: A | Discharge status: Home / Self Care | Payer: BC Managed Care – PPO | Attending: Internal Medicine | Admitting: Internal Medicine

## 2016-03-28 DIAGNOSIS — C9141 Hairy cell leukemia, in remission: Secondary | ICD-10-CM

## 2016-03-28 LAB — COMPREHENSIVE METABOLIC PANEL, BLOOD
ALT (SGPT): 26 U/L (ref 0–41)
AST (SGOT): 41 U/L — ABNORMAL HIGH (ref 0–40)
Albumin: 4.7 g/dL (ref 3.5–5.2)
Alkaline Phos: 53 U/L (ref 40–129)
Anion Gap: 15 mmol/L (ref 7–15)
BUN: 17 mg/dL (ref 6–20)
Bicarbonate: 25 mmol/L (ref 22–29)
Bilirubin, Tot: 0.43 mg/dL (ref <1.2)
Calcium: 9.3 mg/dL (ref 8.5–10.6)
Chloride: 101 mmol/L (ref 98–107)
Creatinine: 1.07 mg/dL (ref 0.67–1.17)
GFR: >60 mL/min
Glucose: 85 mg/dL (ref 70–99)
Potassium: 4.3 mmol/L (ref 3.5–5.1)
Sodium: 141 mmol/L (ref 136–145)
Total Protein: 7.1 g/dL (ref 6.0–8.0)

## 2016-03-28 LAB — CBC WITH DIFF, BLOOD
ANC-Automated: 1.2 10*3/uL — ABNORMAL LOW (ref 1.6–7.0)
ANC-Instrument: 1.2 10*3/uL — ABNORMAL LOW (ref 1.6–7.0)
Abs Lymphs: 1.1 10*3/uL (ref 0.8–3.1)
Abs Monos: 0.1 10*3/uL — ABNORMAL LOW (ref 0.2–0.8)
Eosinophils: 1 %
Hct: 44.8 % (ref 40.0–50.0)
Hgb: 15.3 gm/dL (ref 13.7–17.5)
Lymphocytes: 45 %
MCH: 29 pg (ref 26.0–32.0)
MCHC: 34.2 g/dL (ref 32.0–36.0)
MCV: 84.8 um3 (ref 79.0–95.0)
MPV: 9.8 fL (ref 9.4–12.4)
Monocytes: 5 %
Plt Count: 56 10*3/uL — ABNORMAL LOW (ref 140–370)
RBC: 5.28 10*6/uL (ref 4.60–6.10)
RDW: 15 % — ABNORMAL HIGH (ref 12.0–14.0)
Segs: 48 %
WBC: 2.4 10*3/uL — ABNORMAL LOW (ref 4.0–10.0)

## 2016-03-29 ENCOUNTER — Encounter (HOSPITAL_BASED_OUTPATIENT_CLINIC_OR_DEPARTMENT_OTHER): Payer: Self-pay | Admitting: Internal Medicine

## 2016-03-29 DIAGNOSIS — C9141 Hairy cell leukemia, in remission: Principal | ICD-10-CM

## 2016-03-30 ENCOUNTER — Encounter (INDEPENDENT_AMBULATORY_CARE_PROVIDER_SITE_OTHER): Payer: Self-pay

## 2016-03-30 ENCOUNTER — Ambulatory Visit (INDEPENDENT_AMBULATORY_CARE_PROVIDER_SITE_OTHER): Payer: BC Managed Care – PPO | Admitting: Occupational Health

## 2016-03-30 VITALS — BP 125/80 | HR 76 | Temp 98.8°F | Ht 70.0 in | Wt 181.0 lb

## 2016-03-30 DIAGNOSIS — Z23 Encounter for immunization: Principal | ICD-10-CM

## 2016-03-30 NOTE — Interdisciplinary (Signed)
Per NP Matthess, Influenza vaccine 0.5 ml given IM to left deltoid Lot# [id number] EXP 09/15/16 by Olman Yono Stevens, LVN Patient given Influenza vaccine information.  Patient instructed to remain in clinic for 20 minutes afterwards, and to report any adverse reaction to me immediately.

## 2016-03-30 NOTE — Progress Notes (Signed)
Elijah Davis  MRN: [id number]  DOB: [birth year]  PMD: No Pcp, Per Patient    CC: New Patient    Chief Complaint   Patient presents with    New Patient     flu shot        HPI: Elijah Davis is a 31 year old male  has a past medical history of Hairy cell leukemia (CMS-HCC). who presents with request for Influenza vaccine.     PMHx: see hpi  PSHx:  has no past surgical history on file.  Shx:  reports that he has never smoked. He has quit using smokeless tobacco. He reports that he does not drink alcohol or use illicit drugs.  FamHx: family history includes Breast Cancer in his paternal aunt; Cancer in his maternal grandmother.  Meds: currently has no medications in their medication list.  ALL: Review of patient's allergies indicates no known allergies.    ROS: all other systems reviewed and negative as per my custom and practice for the above complaint    EXAM  BP 125/80 (BP Location: Left leg, BP Patient Position: Sitting, BP cuff size: Regular)   Pulse 76   Temp 98.8 F (37.1 C) (Oral)   Ht 5\' 10"  (1.778 m)   Wt 82.1 kg (181 lb)   SpO2 98%   BMI 25.97 kg/m2    Vitals noted     GEN nad a&o nontoxic well appearing  NEURO mae nl mentation speech and gait    Studies:     Results for orders placed or performed during the hospital encounter of 03/28/16   CBC w/Auto Diff Lavender   Result Value Ref Range    WBC 2.4 (L) 4.0 - 10.0 1000/mm3    RBC 5.28 4.60 - 6.10 mill/mm3    Hgb 15.3 13.7 - 17.5 gm/dL    Hct 44.8 40.0 - 50.0 %    MCV 84.8 79.0 - 95.0 um3    MCH 29.0 26.0 - 32.0 pgm    MCHC 34.2 32.0 - 36.0 g/dL    RDW 15.0 (H) 12.0 - 14.0 %    MPV 9.8 9.4 - 12.4 fL    Plt Count 56 (L) 140 - 370 1000/mm3    ANC-Instrument 1.2 (L) 1.6 - 7.0 1000/mm3    Segs 48 %    Lymphocytes 45 %    Monocytes 5 %    Eosinophils 1 %    ANC-Automated 1.2 (L) 1.6 - 7.0 1000/mm3    Abs Lymphs 1.1 0.8 - 3.1 1000/mm3    Abs Monos 0.1 (L) 0.2 - 0.8 1000/mm3    Diff Type Automated    Comprehensive Metabolic Panel Green Plasma Separator Tube   Result  Value Ref Range    Glucose 85 70 - 99 mg/dL    BUN 17 6 - 20 mg/dL    Creatinine 1.07 0.67 - 1.17 mg/dL    GFR >60 mL/min    Sodium 141 136 - 145 mmol/L    Potassium 4.3 3.5 - 5.1 mmol/L    Chloride 101 98 - 107 mmol/L    Bicarbonate 25 22 - 29 mmol/L    Anion Gap 15 7 - 15 mmol/L    Calcium 9.3 8.5 - 10.6 mg/dL    Total Protein 7.1 6.0 - 8.0 g/dL    Albumin 4.7 3.5 - 5.2 g/dL    Bilirubin, Tot 0.43 <1.2 mg/dL    AST (SGOT) 41 (H) 0 - 40 U/L    ALT (SGPT) 26 0 -  41 U/L    Alkaline Phos 53 40 - 129 U/L       No results found.    New Prescriptions    No medications on file        A/P:   Well appearing patient with PMH of Hairy Cell Leukemia, presents for Influenza vaccine. Patient denies any allergies to eggs, history of Guillain Barre Syndrome, no previous reactions to the flu vaccination or allergic reaction to latex in the past. Consent obtained and vaccination and VIS given.          ICD-10-CM ICD-9-CM    1. Influenza vaccine needed Z23 V04.81 Flu Vaccine, IM >=3 Years     Patient to F/U PMD   Return precautions discussed, the patient understands and agrees with the plan.

## 2016-03-30 NOTE — Patient Instructions (Signed)
Influenza Vaccine  WHAT YOU NEED TO KNOW:  The influenza vaccine is an injection given to help prevent influenza (flu). The flu is caused by a virus. The virus spreads from person to person through coughing and sneezing. Several types of viruses cause the flu. The viruses change over time, so new vaccines are made each year. The vaccine begins to protect you about 2 weeks after you get it. The flu shot usually injected into your upper arm. It may be given in your thigh. You may get a vaccine with a weak or dead virus.   DISCHARGE INSTRUCTIONS:  Call 911 for any of the following:   Your mouth and throat are swollen.    You are wheezing or have trouble breathing.    You have chest pain or your heart is beating faster than normal for you.    You feel like you are going to faint.  Seek care immediately if:   Your face is red or swollen.    You have hives that spread over your body.    You feel weak or dizzy.  Contact your healthcare provider if:   You have increased pain, redness, or swelling around the area where the shot was given.    You have questions or concerns about the influenza vaccine.  Apply a warm compress to the injection area if you got a flu shot. Apply the compress as directed to decrease pain and swelling.  Follow up with your healthcare provider as directed: Write down your questions so you remember to ask them during your visits.

## 2016-04-04 ENCOUNTER — Other Ambulatory Visit (HOSPITAL_BASED_OUTPATIENT_CLINIC_OR_DEPARTMENT_OTHER)
Admission: RE | Admit: 2016-04-04 | Discharge: 2016-04-05 | Disposition: A | Discharge status: Home Health | Payer: BC Managed Care – PPO

## 2016-04-04 ENCOUNTER — Encounter (HOSPITAL_BASED_OUTPATIENT_CLINIC_OR_DEPARTMENT_OTHER): Payer: Self-pay

## 2016-04-04 DIAGNOSIS — C9141 Hairy cell leukemia, in remission: Principal | ICD-10-CM

## 2016-04-04 LAB — COMPREHENSIVE METABOLIC PANEL, BLOOD
ALT (SGPT): 23 U/L (ref 0–41)
AST (SGOT): 32 U/L (ref 0–40)
Albumin: 4.8 g/dL (ref 3.5–5.2)
Alkaline Phos: 55 U/L (ref 40–129)
Anion Gap: 13 mmol/L (ref 7–15)
BUN: 14 mg/dL (ref 6–20)
Bicarbonate: 28 mmol/L (ref 22–29)
Bilirubin, Tot: 0.43 mg/dL (ref <1.2)
Calcium: 9.4 mg/dL (ref 8.5–10.6)
Chloride: 100 mmol/L (ref 98–107)
Creatinine: 1.16 mg/dL (ref 0.67–1.17)
GFR: >60 mL/min
Glucose: 68 mg/dL — ABNORMAL LOW (ref 70–99)
Potassium: 4.2 mmol/L (ref 3.5–5.1)
Sodium: 141 mmol/L (ref 136–145)
Total Protein: 7.1 g/dL (ref 6.0–8.0)

## 2016-04-04 LAB — CBC WITH DIFF, BLOOD
ANC-Automated: 1.4 10*3/uL — ABNORMAL LOW (ref 1.6–7.0)
ANC-Instrument: 1.4 10*3/uL — ABNORMAL LOW (ref 1.6–7.0)
Abs Lymphs: 1 10*3/uL (ref 0.8–3.1)
Abs Monos: 0.1 10*3/uL — ABNORMAL LOW (ref 0.2–0.8)
Hct: 46.2 % (ref 40.0–50.0)
Hgb: 16 gm/dL (ref 13.7–17.5)
Lymphocytes: 40 %
MCH: 29.1 pg (ref 26.0–32.0)
MCHC: 34.6 g/dL (ref 32.0–36.0)
MCV: 84 um3 (ref 79.0–95.0)
MPV: 9.5 fL (ref 9.4–12.4)
Monocytes: 6 %
Plt Count: 63 10*3/uL — ABNORMAL LOW (ref 140–370)
RBC: 5.5 10*6/uL (ref 4.60–6.10)
RDW: 15.2 % — ABNORMAL HIGH (ref 12.0–14.0)
Segs: 54 %
WBC: 2.6 10*3/uL — ABNORMAL LOW (ref 4.0–10.0)

## 2016-04-04 LAB — B CELL LYMPHOMA FLOW CYTOMETRY PANEL

## 2016-04-04 LAB — LDH, BLOOD: LDH: 180 U/L — ABNORMAL HIGH (ref 25–175)

## 2016-04-05 ENCOUNTER — Ambulatory Visit: Payer: BC Managed Care – PPO | Attending: Internal Medicine | Admitting: Internal Medicine

## 2016-04-05 VITALS — BP 156/70 | HR 76 | Temp 98.5°F | Ht 70.0 in | Wt 191.3 lb

## 2016-04-05 DIAGNOSIS — C9141 Hairy cell leukemia, in remission: Principal | ICD-10-CM | POA: Insufficient documentation

## 2016-04-05 NOTE — Progress Notes (Signed)
[place]    Patient's name: Elijah Davis  Date of Birth: [birth year]    Date of visit: 04/05/16    Reason for visit: Follow up for Hairy Cell Leukemia    Keyston Ardolino is a 31 year old male with HCL currently off treatment who returns today for a scheduled follow up visit. For details of his initial presentation and prior treatment please refer to my note from 03/02/14.     Patients fatigue has increased.  Abdominal fullness present.  Some symptoms similar to previous relapse.   No night sweats.  Weight stable.   No recent illness.    Not currently taking any medications aside from some OTC supplements and vitamins.    The patient denies fevers, chills, night sweats, sore throat, blurry vision, difficulty breathing, chest pain, abdominal pain, nausea, vomiting, diarrhea, dysuria, bleeding or easy bruising, neuropathy, depression or anxiety.    The rest of a comprehensive review of systems is negative.    Past Medical History:  He  has a past medical history of Hairy cell leukemia (CMS-HCC).    Social History:  He  reports that he has never smoked. He has quit using smokeless tobacco. He reports that he does not drink alcohol or use illicit drugs.    Medications:    No current outpatient prescriptions on file.      Allergies:  Review of patient's allergies indicates no known allergies.    Vital Signs:  There were no vitals taken for this visit.      Physical Exam:  General Impression: NAD, KPS is 100%  HEENT: PERRL, OP is moist and clear  Lymph nodes: There is no palpable lower cervical, supraclavicular, axillary or groin LAD bilaterally  Lungs: clear to auscultation and percussion bilaterally  Heart: RRR, no m/r/g  Abdomen: Soft, NT, ND without palpable masses or hepatosplenomegaly  Ext: No LE edema  Neuro: A&Ox3, no gross abnormalities    Labs:  Results for orders placed or performed during the hospital encounter of 04/04/16   CBC w/Auto Diff Lavender   Result Value Ref Range    WBC 2.6  (L) 4.0 - 10.0 1000/mm3    RBC 5.50 4.60 - 6.10 mill/mm3    Hgb 16.0 13.7 - 17.5 gm/dL    Hct 46.2 40.0 - 50.0 %    MCV 84.0 79.0 - 95.0 um3    MCH 29.1 26.0 - 32.0 pgm    MCHC 34.6 32.0 - 36.0 g/dL    RDW 15.2 (H) 12.0 - 14.0 %    MPV 9.5 9.4 - 12.4 fL    Plt Count 63 (L) 140 - 370 1000/mm3    ANC-Instrument 1.4 (L) 1.6 - 7.0 1000/mm3    Segs 54 %    Lymphocytes 40 %    Monocytes 6 %    ANC-Automated 1.4 (L) 1.6 - 7.0 1000/mm3    Abs Lymphs 1.0 0.8 - 3.1 1000/mm3    Abs Monos 0.1 (L) 0.2 - 0.8 1000/mm3    Diff Type Automated    Comprehensive Metabolic Panel - See Instructions   Result Value Ref Range    Glucose 68 (L) 70 - 99 mg/dL    BUN 14 6 - 20 mg/dL    Creatinine 1.16 0.67 - 1.17 mg/dL    GFR >60 mL/min    Sodium 141 136 - 145 mmol/L    Potassium 4.2 3.5 - 5.1 mmol/L    Chloride 100  Bicarbonate 28 22 - 29 mmol/L    Anion Gap 13 7 - 15 mmol/L    Calcium 9.4 8.5 - 10.6 mg/dL    Total Protein 7.1 6.0 - 8.0 g/dL    Albumin 4.8 3.5 - 5.2 g/dL    Bilirubin, Tot 0.43 <1.2 mg/dL    AST (SGOT) 32 0 - 40 U/L    ALT (SGPT) 23 0 - 41 U/L    Alkaline Phos 55 40 - 129 U/L   LDH, Blood Green Plasma Separator Tube   Result Value Ref Range    LDH 180 (H) 25 - 175 U/L         Assessment and Plan:    In summary Mr Elijah Davis is a 31 yo man with history of relapsed refractory Hairy Cell Leukemia most recently treated with the BRAF inhibitor dabrafenib which was stopped in October 2015. He continues to do really well clinically and is asymptomatic. His most recent CBC is somewhat concerning for white count of 2.6 and ANC of 1.4. His hgb is normal at 16.0 and he has worse thrombocytopenia at 63. His PB flow cytometry showed an aberrant B cell population comprising 2.4% of total cells consistent with his HCL (up from 0.6%).    Patient is scheduled to have a bone marrow biopsy at [facility] next Tuesday and get a second opinion.   We discussed multiple treatment options, including Debrafanib/tremetinib, Ibrutinib,  moxetumumab pasadotox clinical trial at [facility] and as a last resort allogeneic transplant.   We will await bone marrow biopsy results but would likely recommend starting debrafanib/tremetinib. We also recommended getting NGS with bone marrow biopsy to see if any other targetable pathways present.   We also extensively discussed non-medical treatment options for bone pain, anxiety, sleep. He would like to hold off any medications at this point.   He will contact us after his evaluation.       Addendum:  The patient was seen, examined and discussed with Dr.  on 04/05/16. I agree with his note.  31 yo m with relapsed HCL.  We went over treatment options that include a BRAF inhibitor +/- a MEK inhibitor, ibrutinib and clinical trial enrollment at [facility].  He will be seen by Dr. Saven at [facility] for a consultation prior to deciding about the next step.    A total of 40' were spent in face to face consultation and coordination of care.

## 2016-04-09 ENCOUNTER — Other Ambulatory Visit (HOSPITAL_BASED_OUTPATIENT_CLINIC_OR_DEPARTMENT_OTHER): Payer: BC Managed Care – PPO | Admitting: Nurse Practitioner

## 2016-04-10 ENCOUNTER — Encounter (HOSPITAL_BASED_OUTPATIENT_CLINIC_OR_DEPARTMENT_OTHER): Payer: Self-pay | Admitting: Internal Medicine

## 2016-04-10 DIAGNOSIS — C9142 Hairy cell leukemia, in relapse: Principal | ICD-10-CM

## 2016-04-10 DIAGNOSIS — R530 Neoplastic (malignant) related fatigue: Secondary | ICD-10-CM

## 2016-04-10 MED ORDER — MODAFINIL 200 MG OR TABS
200.0000 mg | ORAL_TABLET | Freq: Every morning | ORAL | 2 refills | Status: DC
Start: 2016-04-10 — End: 2017-09-26

## 2016-04-11 ENCOUNTER — Encounter (HOSPITAL_BASED_OUTPATIENT_CLINIC_OR_DEPARTMENT_OTHER): Payer: Self-pay | Admitting: Internal Medicine

## 2016-04-11 DIAGNOSIS — C9142 Hairy cell leukemia, in relapse: Principal | ICD-10-CM

## 2016-04-12 LAB — B CELL LYMPHOMA FLOW CYTOMETRY PANEL

## 2016-04-20 ENCOUNTER — Other Ambulatory Visit
Admission: RE | Admit: 2016-04-20 | Discharge: 2016-04-20 | Disposition: A | Discharge status: Home / Self Care | Payer: BC Managed Care – PPO | Attending: Internal Medicine | Admitting: Internal Medicine

## 2016-04-20 DIAGNOSIS — C9142 Hairy cell leukemia, in relapse: Secondary | ICD-10-CM | POA: Insufficient documentation

## 2016-04-20 DIAGNOSIS — C9141 Hairy cell leukemia, in remission: Secondary | ICD-10-CM

## 2016-04-20 LAB — CBC WITH DIFF, BLOOD
ANC-Automated: 0.7 10*3/uL — ABNORMAL LOW (ref 1.6–7.0)
Abs Lymphs: 0.8 10*3/uL (ref 0.8–3.1)
Abs Monos: 0.1 10*3/uL — ABNORMAL LOW (ref 0.2–0.8)
Eosinophils: 1 %
Hct: 41.8 % (ref 40.0–50.0)
Hgb: 14.1 gm/dL (ref 13.7–17.5)
Lymphocytes: 49 %
MCH: 29.3 pg (ref 26.0–32.0)
MCHC: 33.7 g/dL (ref 32.0–36.0)
MCV: 86.7 um3 (ref 79.0–95.0)
MPV: 10 fL (ref 9.4–12.4)
Monocytes: 7 %
Plt Count: 58 10*3/uL — ABNORMAL LOW (ref 140–370)
RBC: 4.82 10*6/uL (ref 4.60–6.10)
RDW: 15.8 % — ABNORMAL HIGH (ref 12.0–14.0)
Segs: 43 %
WBC: 1.7 10*3/uL — ABNORMAL LOW (ref 4.0–10.0)

## 2016-04-20 LAB — COMPREHENSIVE METABOLIC PANEL, BLOOD
ALT (SGPT): 16 U/L (ref 0–41)
AST (SGOT): 26 U/L (ref 0–40)
Albumin: 4.4 g/dL (ref 3.5–5.2)
Alkaline Phos: 45 U/L (ref 40–129)
Anion Gap: 12 mmol/L (ref 7–15)
BUN: 16 mg/dL (ref 6–20)
Bicarbonate: 26 mmol/L (ref 22–29)
Bilirubin, Tot: 0.4 mg/dL (ref <1.2)
Calcium: 9 mg/dL (ref 8.5–10.6)
Chloride: 105 mmol/L (ref 98–107)
Creatinine: 1.1 mg/dL (ref 0.67–1.17)
GFR: >60 mL/min
Glucose: 92 mg/dL (ref 70–99)
Potassium: 4.6 mmol/L (ref 3.5–5.1)
Sodium: 143 mmol/L (ref 136–145)
Total Protein: 6.8 g/dL (ref 6.0–8.0)

## 2016-04-20 LAB — LDH, BLOOD: LDH: 179 U/L — ABNORMAL HIGH (ref 25–175)

## 2016-04-23 LAB — B CELL LYMPHOMA FLOW CYTOMETRY PANEL

## 2016-05-03 LAB — NGS HEMATOLOGIC MALIGNANCY PANEL, BLOOD

## 2016-05-30 DIAGNOSIS — F988 Other specified behavioral and emotional disorders with onset usually occurring in childhood and adolescence: Secondary | ICD-10-CM

## 2016-07-09 ENCOUNTER — Encounter (HOSPITAL_BASED_OUTPATIENT_CLINIC_OR_DEPARTMENT_OTHER): Payer: Self-pay | Admitting: Internal Medicine

## 2016-09-10 ENCOUNTER — Encounter (HOSPITAL_BASED_OUTPATIENT_CLINIC_OR_DEPARTMENT_OTHER): Payer: Self-pay | Admitting: Internal Medicine

## 2016-12-20 ENCOUNTER — Encounter (HOSPITAL_BASED_OUTPATIENT_CLINIC_OR_DEPARTMENT_OTHER): Payer: Self-pay | Admitting: Internal Medicine

## 2017-04-26 DIAGNOSIS — F5101 Primary insomnia: Secondary | ICD-10-CM

## 2017-04-26 DIAGNOSIS — F1121 Opioid dependence, in remission: Secondary | ICD-10-CM

## 2017-05-07 DIAGNOSIS — D431 Neoplasm of uncertain behavior of brain, infratentorial: Secondary | ICD-10-CM

## 2017-06-04 DIAGNOSIS — R339 Retention of urine, unspecified: Secondary | ICD-10-CM

## 2017-06-04 DIAGNOSIS — F1220 Cannabis dependence, uncomplicated: Secondary | ICD-10-CM

## 2017-06-04 DIAGNOSIS — F1320 Sedative, hypnotic or anxiolytic dependence, uncomplicated: Secondary | ICD-10-CM

## 2017-06-04 DIAGNOSIS — G40909 Epilepsy, unspecified, not intractable, without status epilepticus: Secondary | ICD-10-CM

## 2017-08-26 ENCOUNTER — Encounter (HOSPITAL_BASED_OUTPATIENT_CLINIC_OR_DEPARTMENT_OTHER): Payer: Self-pay | Admitting: Internal Medicine

## 2017-08-27 ENCOUNTER — Encounter (HOSPITAL_BASED_OUTPATIENT_CLINIC_OR_DEPARTMENT_OTHER): Payer: Self-pay | Admitting: Internal Medicine

## 2017-08-27 DIAGNOSIS — C9141 Hairy cell leukemia, in remission: Principal | ICD-10-CM

## 2017-08-30 ENCOUNTER — Encounter (HOSPITAL_BASED_OUTPATIENT_CLINIC_OR_DEPARTMENT_OTHER): Payer: Self-pay | Admitting: Internal Medicine

## 2017-08-30 DIAGNOSIS — C9141 Hairy cell leukemia, in remission: Principal | ICD-10-CM

## 2017-09-03 ENCOUNTER — Encounter: Payer: Self-pay | Admitting: Hospital

## 2017-09-04 ENCOUNTER — Ambulatory Visit (INDEPENDENT_AMBULATORY_CARE_PROVIDER_SITE_OTHER): Payer: BC Managed Care – PPO | Admitting: Acupuncturist

## 2017-09-04 DIAGNOSIS — R51 Headache: Secondary | ICD-10-CM

## 2017-09-04 DIAGNOSIS — R5383 Other fatigue: Secondary | ICD-10-CM

## 2017-09-04 DIAGNOSIS — R519 Headache, unspecified: Secondary | ICD-10-CM

## 2017-09-04 NOTE — Progress Notes (Signed)
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 He reports multiple remissions and relapses over the last five years.   Bowel Movements: States his bowels are usually formed easy to pass. Loose stools following a trip last week. Returning to regular state.  Sleep: States he sleeps well overall. 7-8 hours/week.   Digestion: denies gas bloating and gerd. Appetite is stable. Eats one bigger meal a day. With some snacks as needed   Mental/Emotional: Fatigue is Markon's primary concern today.  HEENT: minor headaches. Infrequent. States they typically cause pain in the occiput or forehead.     Last treatment evaluation  Did last treatment help? Initial visit     Physical Exam  na    Pulse Left  Pulse Left Position: Guan  Pulse Left Guan Depth: medium depth  Pulse Left Guan Force: moderate  Pulse Left Guan Quality: soft    Pulse Right  Pulse Right Position: Guan  Pulse Left Guan Depth: medium depth  Pulse Right Guan Force: moderate  Pulse Right Guan Quality: soft      Tongue  Tongue Color: pale;pink;dusky(red tip)  Tongue Moisture: moist      Last Treatment  Did Last Treament Help: some      Technique  Technique: Acupuncture      Treatment Points  Lung: 7  Lung Laterality: Bilateral  Large Intestine: 11;4  Large Intestine Laterality: Bilateral  Stomach: 36;25  Stomach Laterality: Bilateral  Spleen: 6  Spleen Laterality: Bilateral  Kidney: 3  Kidney Laterality: Bilateral  [place]: 5  [place] Laterality: Bilateral  Gall Bladder: 41;34  Gall Bladder Laterality: Bilateral  Liver: 3  Liver Laterality: Bilateral  Ren:  12  Du: 19    Diagnosis  TCM Dx: Spleen qi deficiency.       Biomedical Assessment: The primary encounter diagnosis was Nonintractable headache, unspecified chronicity pattern, unspecified headache type. A diagnosis of Fatigue, unspecified type was also pertinent to this visit.    Plan: acupuncture weekly for four weeks then reassess.     Supplemental Techniques:   acupuncture    Herbal Recommendations:   Discuss herbal options at follow up appointment. Consider tian wan bu xin dan. Or shi quan da bu tang     Total face to face contact time with patient rendering acupuncture services as described above greater than 25 minutes. Services rendered above reflect an additional 15 minutes of treatment with reinsertion of needles at du 20

## 2017-09-11 ENCOUNTER — Ambulatory Visit (INDEPENDENT_AMBULATORY_CARE_PROVIDER_SITE_OTHER): Payer: BC Managed Care – PPO | Admitting: Acupuncturist

## 2017-09-11 DIAGNOSIS — M545 Low back pain: Secondary | ICD-10-CM

## 2017-09-11 DIAGNOSIS — R5383 Other fatigue: Secondary | ICD-10-CM

## 2017-09-11 DIAGNOSIS — M542 Cervicalgia: Secondary | ICD-10-CM

## 2017-09-11 NOTE — Progress Notes (Signed)
[facility] Acupuncture Note    September 11, 2017    Patient: Elijah Davis  Age: 32 year old   Sex: male    Treatment Number: 2    Requesting Physician: Devonne Doughty    Reason for Visit:   Chief Complaint   Patient presents with    Low Back Pain    Neck Pain         Review of Systems   Pain: Elijah Davis returned for acupuncture therapy. He states that he started developing an aching pain in the lower back about 4 days ago. He believes it comes from increasing his yoga regiment. He also describes pain and tightness in the neck and shoulders, which he says is a frequent pain of his.     Sleep: States sleep is "okay" this week. States he is a bit restless   Digestion: Reports that digestion seems to have improved this week. Energy overall has been better.     Last treatment evaluation  Did last treatment help? some    Physical Exam  Na                        Last Treatment  Did Last Treament Help: some      Technique  Technique: Acupuncture      Treatment Points  Urinary Bladder: 13;15;18;20;23;50;52;28;29;30;31  Kidney: 3  Kidney Laterality: Bilateral  Gall Bladder: 20;21;34  Gall Bladder Laterality: Right  Du: 20;4    Diagnosis  TCM Dx: Spleen qi deficiency. qi and blood stasis in the ub and gb       Biomedical Assessment: The primary encounter diagnosis was Fatigue, unspecified type. Diagnoses of Neck pain and Bilateral low back pain, unspecified chronicity, with sciatica presence unspecified were also pertinent to this visit.    Plan: acupuncture as needed to manage pain, promote wellness     Supplemental Techniques:   acupuncture    Herbal Recommendations:   Na     Total face to face contact time with patient rendering acupuncture services as described above greater than 25 minutes. Services rendered above reflect an additional 15 minutes of treatment with reinsertion of needles at du 20

## 2017-09-24 ENCOUNTER — Ambulatory Visit (INDEPENDENT_AMBULATORY_CARE_PROVIDER_SITE_OTHER): Payer: BC Managed Care – PPO

## 2017-09-24 ENCOUNTER — Encounter (INDEPENDENT_AMBULATORY_CARE_PROVIDER_SITE_OTHER): Payer: Self-pay | Admitting: Hospital

## 2017-09-24 DIAGNOSIS — M9900 Segmental and somatic dysfunction of head region: Secondary | ICD-10-CM

## 2017-09-24 DIAGNOSIS — M9905 Segmental and somatic dysfunction of pelvic region: Secondary | ICD-10-CM

## 2017-09-24 DIAGNOSIS — M9903 Segmental and somatic dysfunction of lumbar region: Secondary | ICD-10-CM

## 2017-09-24 DIAGNOSIS — M9904 Segmental and somatic dysfunction of sacral region: Secondary | ICD-10-CM

## 2017-09-24 DIAGNOSIS — M9902 Segmental and somatic dysfunction of thoracic region: Secondary | ICD-10-CM

## 2017-09-24 DIAGNOSIS — R5383 Other fatigue: Secondary | ICD-10-CM

## 2017-09-24 DIAGNOSIS — M9901 Segmental and somatic dysfunction of cervical region: Secondary | ICD-10-CM

## 2017-09-24 NOTE — Patient Instructions (Signed)
As per chart

## 2017-09-24 NOTE — Progress Notes (Signed)
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  In remission x 2 mos with b raf inhibitors.    C/o fatigue with this.    Sometimes battles whether this is from hiking or from cancer.    Tries to eat mostly organic - has done vegetarian and vegan diets, raw foods - caused a lot of gas.  Has tried low carb.    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    Pt does not want to be on opioids and stimulants.      Bilateral achilles pain - thinks it is due to hiking    Does acupuncture and massage; interested in aruveydic  MDSR and acupuncture, soiund healing.    Pt has been taking seroquel - sleep has been fine but feels this has aided it.  Takes 100 mg.  soetimes has trouble falling asleep.    Left sided nasal congestion.    Birth history: vaginal without complications.    Pt is eldest of 4    No trauma in infancy; no mental or physical trauma    1st grade left side of shirt caught and fell and hit head went to the hospital but didn't stay.  Had mild concussion.    Small shadow in midbrain 62mm -- not growing on second mri.    High school played soccer and tennis - in left knee osgood schlatter - PT and no pain now.    Bruised left ankle in ski incident in 8th grade.  Was playing basketball.  Thinks he may not load into that foot well.    Occasionally will feel bilateral hip pop with flexion.    College all academic - finance.    Graduated 2009 worked in [employer] 80 hour weeks very stressful.    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  Saw oncology and was hospitalized.      He was having fatigue and night sweats, rashes, dry skin, lesions on face.    Depression/anxiety.  Prescription opioid use.    Significant  back pain at the time.  During chemo had back pain, pain above tailbone and on the left side.  Pain dissapated but still has discomfort there.     Right now feels fairly stress free.    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  Also entrapeneruial side jobs which he likes but he hates having to work for a corporation.    Biggest goal is to find whatever he wants to do with passion and meaning in life and do it every day.    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        Has battle in mind if "is my blood going bad?" am I fatuged?  Why?    When more physically fit and mind is sharper with meditation he feels better.  Major tirggers for him are fatigue.       OBJECTIVE     GEN: Appear/G-Abn(-) with distress   Posture: Cervical lordosis: increased __.decreased __. normal _x_.  Thoracic kyphosis:  increased __.decreased __. normal _x_.   Lumbar lordosis: increased __.decreased __. normal _x_.   Lateral gravitational line:  TART changes : Tissue texture changes ____  Asymmetry ____ Restriction ____ Tenderness ____ Cervical ____ Thoracic ____  Ribs ____  Lumbar ____ Sacral ____  UE ____  LE ____  ROM Decreased in: Cervical: Flexion ____  Extension ____ Sidebending ____ Rotation ____ Thoracic: Flexion ____ Extension ____ Sidebending ____ Rotation ____ Lumbar: Flexion __x__ Extension x____  Sidebending __x__ Rotation __x__  CRI rate: low __. med x__. high __. CRI amplitude normal __. decreased __. increased __. SBS strain pattern: normal __. torsion __. SB/rotation _x_. SBS compression __. lateral strain __. vertical __. flexion __. extension __.  Inspection: Tenderness in paraspinous muscles in: Lumbosacral ____ Cervical ____ Thoracic ____  Spasm in paraspinous muscles in: Lumbosacral ____x Cervical __x__ Thoracic ____  Hip: Gluteus medius/weak hip hike __. left __. right __. restriction in internal rotation __. restriction in  external rotation __x.     Compression: lumbosacral ___x_ sacroiliac __x__ OA _x___ Condylar __x__        NECK: NO TRACHEAL DEVIATION  HEENT: N/C, A/T, PERRLA, EOMI, CONJUNCTIVA WNL,  MUCOUS MEMBRANES MOIST  EARS: HEARING WNL  NOSE: EXTERNAL NARES WNL  LUNGS: NO RESPIRATORY DISTRESS, NO AUDIBLE WHEEZING, SPEAKING IN FULL COMPLETE SENTENCES      ABD: SOFT, NON-DISTENDED   NEURO: NO GROSS MOTOR DEFICITS  PSYCH: PT ALERT AND ORIENTED WITH NORMAL MOOD AND AFFECT    ASSESSMENT  Cranial/Head somatic dysfunction 739.0__x__   Cervical somatic dysfunction 739.1__x__   Thoracic somatic dysfunction 739.2__x__   Rib somatic dysfunction 739.8____   Lumbar somatic dysfunction 739.3__x__   Sacral somatic dysfunction 739.4___x_   Pelvic somatic dysfunction 739.5__x__   Upper extremity somatic dysfunction 739.7____   Lower extremity somatic dysfunction 739.6____   Abd/Other Somatic Dysfunction 739.9____   Low Back Pain 724.2____   Neck Pain 723.1____   Foot pronation 736.79____     PLAN  Patient informed of risks and benefits of OMT, and patient/Guardian has consented to treatment.  Exercise handout given, demonstrated and discussed ____   Reviewed proper biomechanics and ergonomics ____   Nutritional advice given ____   OMT: S/CS _x___ (to listed above tenderpoints) MFR _x___ FPR x____ ME ____ ART ____ Ligamentous balancing ____ Diaphragm release ____ Decompression lumbosacral ___x_ Sacroiliac decompression _x___ OA-Decompression ____ Condylar Decompression  __x__ Cranial Osteopathy __x__ CV4 ____ Ven-Sin ____ Frontal lift ____ Parietal lift ____ Sphenopalatine ganglion release ____ Temporomandibular joint balancing ____   Treatment time: 63min ____ . 60 min ___x_ . 4min ____ . 132min ____ .  Post treatment pain level:   Improved __x__ . Unchanged ____ . Worse ____ .  Treatment/Complex/Data: Minimal. ____ Limited. ____ Moderate. _x___ Extensive____ .  Dx/Manage:  Minimal. __ Limited. __ Multiple. _x_ Extensive.   Return to the  clinic in 2 weeks__x__ ,  1 mo____ , PRN____   Return if problems develop or if worsens __x__

## 2017-09-25 ENCOUNTER — Ambulatory Visit (INDEPENDENT_AMBULATORY_CARE_PROVIDER_SITE_OTHER): Payer: BC Managed Care – PPO | Admitting: Acupuncturist

## 2017-09-25 DIAGNOSIS — M545 Low back pain: Secondary | ICD-10-CM

## 2017-09-25 DIAGNOSIS — M542 Cervicalgia: Principal | ICD-10-CM

## 2017-09-25 NOTE — Progress Notes (Signed)
[facility] Acupuncture Note    September 25, 2017    Patient: Elijah Davis  Age: 32 year old   Sex: male    Treatment Number: 3    Requesting Physician: Devonne Doughty    Reason for Visit:   Chief Complaint   Patient presents with    Neck Pain    Low Back Pain         Review of Systems   Pain: Creedence returned for acupuncture therapy. He states he recently returned from a hiking trip. He states he his neck and shoulders are sores from carrying his gear. He is also states a lot of pain in the heels bilaterally. He continues to have soreness in the lower back. Overall he states his energy is improving. He informs me he is scheduled for additional integrative consults including nutrition.     Last treatment evaluation  Did last treatment help? some    Physical Exam  Na     Last Treatment  Did Last Treament Help: some      Technique  Technique: Acupuncture      Treatment Points  Urinary Bladder: 13;15;18;20;23;50;52;28;29;30;31  Kidney: 3  Kidney Laterality: Bilateral  Gall Bladder: 20;21;34  Gall Bladder Laterality: Right  Du: 20;4    Diagnosis  TCM Dx: Spleen qi deficiency. qi and blood stasis in the ub and gb     Biomedical Assessment: The primary encounter diagnosis was Neck pain. A diagnosis of Bilateral low back pain, unspecified chronicity, with sciatica presence unspecified was also pertinent to this visit.    Plan: acupuncture as needed to manage pain     Supplemental Techniques:   acupuncture    Herbal Recommendations:   Na     Total face to face contact time with patient rendering acupuncture services as described above greater than 25 minutes. Services rendered above reflect an additional 15 minutes of treatment with reinsertion of needles at du 20

## 2017-09-26 ENCOUNTER — Ambulatory Visit (INDEPENDENT_AMBULATORY_CARE_PROVIDER_SITE_OTHER): Admitting: Neurology

## 2017-09-26 ENCOUNTER — Encounter

## 2017-09-26 ENCOUNTER — Encounter (INDEPENDENT_AMBULATORY_CARE_PROVIDER_SITE_OTHER): Payer: Self-pay | Admitting: Neurology

## 2017-09-26 VITALS — BP 133/83 | HR 85 | Ht 70.5 in | Wt 195.4 lb

## 2017-09-26 DIAGNOSIS — R4020 Unspecified coma: Principal | ICD-10-CM

## 2017-09-26 DIAGNOSIS — R9082 White matter disease, unspecified: Secondary | ICD-10-CM

## 2017-09-26 MED ORDER — GABAPENTIN 600 MG OR TABS: 600.00 mg | ORAL_TABLET | ORAL | Status: AC | PRN

## 2017-09-26 MED ORDER — BUPROPION XL (DAILY) 300 MG OR TB24
300.00 mg | ORAL_TABLET | ORAL | Status: DC
Start: 2017-07-30 — End: 2018-01-10

## 2017-09-26 MED ORDER — QUETIAPINE FUMARATE 100 MG OR TABS
100.00 mg | ORAL_TABLET | ORAL | Status: AC
Start: 2017-06-11 — End: ?

## 2017-09-26 MED ORDER — LEVETIRACETAM 500 MG OR TABS
500.00 mg | ORAL_TABLET | Freq: Two times a day (BID) | ORAL | Status: DC
Start: 2017-05-29 — End: 2018-01-10

## 2017-09-27 ENCOUNTER — Encounter (INDEPENDENT_AMBULATORY_CARE_PROVIDER_SITE_OTHER): Payer: Self-pay | Admitting: Family Practice

## 2017-09-27 ENCOUNTER — Ambulatory Visit (INDEPENDENT_AMBULATORY_CARE_PROVIDER_SITE_OTHER): Payer: BC Managed Care – PPO | Admitting: Family Practice

## 2017-09-27 ENCOUNTER — Encounter: Payer: Self-pay | Admitting: Hospital

## 2017-09-27 VITALS — BP 130/75 | HR 87 | Temp 98.0°F | Ht 70.5 in | Wt 195.0 lb

## 2017-09-27 DIAGNOSIS — C9141 Hairy cell leukemia, in remission: Secondary | ICD-10-CM

## 2017-09-27 NOTE — Progress Notes (Signed)
INTEGRATIVE RETURN CONSULTATION    Referred by: Devonne Doughty, MD  Primary care physician:   Gavin Potters     SUBJECTIVE:  Elijah Davis is a 32 year old male who presents for an integrative visit, for help with Integrative Medicine     Goal (CIM questionnaire): "Alternative methods for remaining healthy and fighting cancer holistically rather than simply waiting for additional rounds of chemo"    1.  Hairy Cell Leukemia  Diagnosed 2014, has relasped a couple of times, was told that he is one of the youngest patients with this disease. He has spent the last 5 years changing his lifestyle after the diagnosis. 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  His biggest goal is to find passion, happiness, and meaning in life. In his twenties he was chasing money, but his goal now is to develeop skills that he can give back to his community.     After chemo treatments, it 'pulls' him down for a while. BRAF inhibitors have worked well for him but with fatigue and finger swelling which makes him unable to type for a few days. Fatigue is the biggest post-infusion side effect, but is unsure if it is due to his high level of activity or due to drop in blood count. He believes in the "mind-body spirit" connection, the influence of thoughts on the body and spirit, he believes that they are inseparable and come as a package.  Meditation has helped him put a 'shield' around his thoughts, but it's a battle.     Very good network of friends in the recovery community. [facility] in particular.  When he first started chemo he was prescribed many painkillers and opoids for pain due to back pain caused by spleen enlargement, but over time he thinks he  was using it for emotional pain. States he "spent a lot of time unpacking that," went on a detox from the painkillers. He tries to avoid any compounds that affect the mind.   In doing this, he found a group of mind who are very supportive of this lifestyle, has about 50-100 people he can call at anytime, also lives with close friends. Recently broke up with a girlfriend of 2 years, felt their paths were diverging.     Had genome testing. Would love to have any tests his insurance would pay for. Adds he has been concerned about his testosterone levels dropping because of all of his treatments.    Grew up in [place], [place]. Mentions that they would send coke (coal) down the river next to his house . 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    Family history   Maternal grandmother had a major brain tumor a size of a softball, doesn't know if it was cancerous. Paternal aunt is in remission with breast cancer.     Three healthy siblings    2. Digestion:   'Very normal'. Thinks acupuncture helps with daily normal BMs, does acupucture with Sam Fish, has seen him 2-3 times so far . Hiking also helps with regular BMs.   In the past, his BMs were  on the looser side . Then when he was on opoids where they were harder.     Social History, Inclusive:   He is working for an Administrator, sports.    Grew up in [place], went to school in [place], then moved to [place] for work, then [employer]    3 siblings, all younger than him    Parents divorced a few years ago, which was difficult for him to understand as they were close when he was growing up. Has an uncle that is very influential in his life in [place]    Diet:  Overall approach = high protein and fat, low carb. Eats mostly organic and local.     Caffeine:   Water:   Eating Habits:   Loves to cook.     Tried raw, vegan, vegetarian,  keto, low-carb. Raw caused a lot of gas and GI issues. Keto gave him the most energy. Believes nutrition is a very important part of his health.     Recently eating high protein and fat, approx 20% starches and carbs, 50% protein, 30% vegetables. He tries to eat less red meat, more salmon and chicken, [place], leaner meats with clarified butter, ghee, avocados, avocado oil, olive oil. Loves beans, hummus, but has been Brother is getting married in Sept, is trying to slim down. Lots of big leafy greens, fiber heavy vegetables, spinach, cauliflower, brocolli, cucumber, carrots. Eats many berries, tries to stay away from bananas and higher sugar fruits . Read beets are very good for the blood, drinks a lot of beet juice with habanero, pineapple.    Looked into Ayurvedic health concepts. Loves spicy foods but doesn't know if its the best thing for him.      Juicing with ginger and turmeric roots. Has maca, spirulina, cacao powders.     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     He would like to see a nutritionist, but his insurance will only cover it if he has an eating disorder, adds sister in law is a Insurance risk surveyor and he could consult her.         Exercise: 3-4 days a week,  just came back from an 8 day hike in the [place].    He doesn't like to be stagnant because he states it is very easy to fall him in a pattern of laziness. Many of his friends are highly motivated which motivate him. He tries to mix up his physical activity daily: yoga, volleyball, biking. Every weekend, he and his friends pick an activity and do it. States his biggest struggle is to get the ball rolling, but once its rolling he follows through. Without the support, it would be very easy for him to lean into laziness, he is very grateful for his support group.       Sleep: 8 hours a night, 7-9 hours is ideal, thinks sleep is the most important thing, sleeps at 10am and gets  up 6-7am, found that out the hard way in the past for work     Social Support: very good network of friends in the recovery community, roommates are close friends  Health Practices:     Supplements: (in addition to prescribed medications)   Used Marijuana in the past recreationally  Used heavy CBD doses with small amount of THC: used consistently when on the B-raf inhibitor, helped with back pain and fatigue  a lot, uses indica THC sometimes to help with sleep, or when he comes back from a hike. One of the side projects he is considering is growing 400 acres of hemp in [place], adds he wants to align it with what's going to be helpful for the world.     Prior CAM use:    Into yoga and meditation, crystal balls and sound healing. He studied music in school and finds music as a healing aspect of life.   Recently started intensive short term ISTBT at [facility], goes to their [facility] meetings as well. Finds their triangle approach, mid body spirit approach, to health helpful.     He has done a workshop and meditated with Elijah Davis, where he was completely vegetarian for a week.     Acupuncture : currently using, has done acupuncture for a long time, before chemo.   OMT: recently tried, thought it was helpful  Biofeedback: in the past  Chiropractic: used in the past  Deep tissue work: used in the past  Energy work: currently using, very interested in it   Meditation: currently Aeronautical engineer or guided imagery: currently use  Spiritual advising: currently using   CBT      Restorative Practices:     Emotional and Spiritual Health:     General Emotional State:   Spiritual Practices:     High Risk Behaviors:  None; See Below for habits as in EPIC fields       Outpatient Medications Marked as Taking for the 09/27/17 encounter (Integrative Medicine) with Elijah Forehand, MD   Medication Sig Dispense Refill    buPROPion (WELLBUTRIN) 300 MG Extended-Release tablet 300 mg by Oral route.      gabapentin  (NEURONTIN) 600 MG tablet Take 600 mg by mouth as needed.      levETIRAcetam (KEPPRA) 500 MG tablet 500 mg by Oral route 2 times daily.       QUEtiapine (SEROQUEL) 100 MG tablet 100 mg by Oral route.          Allergies to medications:  Patient has no known allergies.  Past Medical History:   Diagnosis Date    Hairy cell leukemia (CMS-HCC)      No past surgical history on file.  Family History   Problem Relation Name Age of Onset    Breast Cancer Paternal Aunt      Cancer Maternal Grandmother          brain tumor     Social History     Socioeconomic History    Marital status: Single     Spouse name: Not on file    Number of children: Not on file    Years of education: Not on file    Highest education level: Not on file   Occupational History    Not on file   Social Needs    Financial resource strain: Not on file    Food insecurity:     Worry: Not on file     Inability: Not on file    Transportation needs:     Medical: Not on file     Non-medical: Not on file   Tobacco Use    Smoking status: Never Smoker    Smokeless tobacco: Former Systems developer   Substance and Sexual Activity    Alcohol use: No     Alcohol/week: 0.0 oz    Drug use: No    Sexual activity: Not on file   Lifestyle  Physical activity:     Days per week: Not on file     Minutes per session: Not on file    Stress: Not on file   Relationships    Social connections:     Talks on phone: Not on file     Gets together: Not on file     Attends religious service: Not on file     Active member of club or organization: Not on file     Attends meetings of clubs or organizations: Not on file     Relationship status: Not on file    Intimate partner violence:     Fear of current or ex partner: Not on file     Emotionally abused: Not on file     Physically abused: Not on file     Forced sexual activity: Not on file   Other Topics Concern    Not on file   Social History Narrative    Not on file     I did review the available past medical, family, and  social history from the electronic records.     OBJECTIVE:  BP 130/75    Pulse 87    Temp 98 F (36.7 C)    Ht 5' 10.5" (1.791 m)    Wt 88.5 kg (195 lb)    SpO2 98%    BMI 27.58 kg/m  Body mass index is 27.58 kg/m.  Well Developed, Well nourished male in No Acute Distress  Mood & Affect are normal  Davis: normocephalic, atraumatic  Eyes: anicteric OU  Neuro: Oriented, alert, cooperative, Gait: normal  Skin: exposed areas free of rash and no significant lesions    pertinent Labs & Imaging: reviewed    ASSESSMENT & PLAN:  Encounter Diagnoses   Name Primary?    Hairy cell leukemia, in remission (CMS-HCC) Yes       Time spent: Over 50% of this 75 minute visit was devoted to integrative coordination of care, counseling and answering patient's questions and concerns about the diagnoses listed above. Also reviewed likely alternative diagnoses, potential diagnostic tests, and treatment considerations for those diagnoses. I educated him about self care as well.     Orders Placed This Encounter   Procedures    General Integrative Medicine Consult     Return in about 4 weeks (around 10/25/2017) for return CIM Dr. Mariea Stable.    Patient Instructions   Thank you Gilman for coming in today, it was a pleasure meeting you. Regarding our visit today, I want to sum up our discussion and recommendations I have:    I recommend reading 'The [place] Study' by T. Heath Gold, and/or watching the documentary on https://nutritionstudies.org/the-[place]-study/ , which examines the connection between nutrition and cancer.     You can check out the Environmental Working Group's annual list of 'The Dirty Dozen and The Clean 15' for which foods are best bought organic. https://www.neal.com/    In general, I recommend that your daily diet is at least 50% vegetables . If you are interested in creating a comfortable and tailored nutrition plan, I recommend consulting a nutritionist.     I am supportive of your use of CBD as it works for you. If  you are interested, we can discuss some reliable brands next visit,     I have referred you to Dr. Delsa Bern, who is an expert in [place] and meditative yoga.     You may also look into [employer], whose story may  resonate with you.     You may call our integrative number [phone] to schedule an appointment with me and Dr. Alda Ponder.     Thank you,  Dr. Mariea Stable      Diagnosis, available results of Labs/tests (if any) reviewed with patient and any questions answered.  Barriers to Learning assessed: none. Patient verbalizes understanding of teaching and instructions.    Relevant goal-setting conducted today for above condition(s).    See follow up as entered into the EPIC field  Electronically signed by  Gabriel Earing MD    I am personally taking down the notes in the presence of Dr. Mariea Stable, MD. -- Tommi Rumps, 09/27/2017 10:53 AM      I, Dr. Gabriel Earing, personally performed the services described in this documentation, as scribed by Tommi Rumps in my presence, and it is both accurate and complete. 09/27/17

## 2017-09-27 NOTE — Patient Instructions (Addendum)
Thank you Feras for coming in today, it was a pleasure meeting you. Regarding our visit today, I want to sum up our discussion and recommendations I have:    I recommend reading 'The [place] Study' by T. Heath Gold, and/or watching the documentary on https://nutritionstudies.org/the-[place]-study/ , which examines the connection between nutrition and cancer.     You can check out the Environmental Working Group's annual list of 'The Dirty Dozen and The Clean 15' for which foods are best bought organic. https://www.neal.com/    In general, I recommend that your daily diet is at least 50% vegetables . If you are interested in creating a comfortable and tailored nutrition plan, I recommend consulting a nutritionist.     I am supportive of your use of CBD as it works for you. If you are interested, we can discuss some reliable brands next visit,     I have referred you to Dr. Delsa Bern, who is an expert in [place] and meditative yoga.     You may also look into [employer], whose story may resonate with you.     You may call our integrative number [phone] to schedule an appointment with me and Dr. Alda Ponder.     Thank you,  Dr. Mariea Stable

## 2017-09-30 ENCOUNTER — Encounter (INDEPENDENT_AMBULATORY_CARE_PROVIDER_SITE_OTHER): Payer: Self-pay | Admitting: Neurology

## 2017-10-02 ENCOUNTER — Ambulatory Visit (INDEPENDENT_AMBULATORY_CARE_PROVIDER_SITE_OTHER): Payer: BC Managed Care – PPO | Admitting: Acupuncturist

## 2017-10-02 DIAGNOSIS — M545 Low back pain: Secondary | ICD-10-CM

## 2017-10-02 DIAGNOSIS — M542 Cervicalgia: Principal | ICD-10-CM

## 2017-10-02 NOTE — Progress Notes (Signed)
CIM Acupuncture Note    October 02, 2017    Patient: Elijah Davis  Age: 32 year old   Sex: male    Treatment Number: 4    Requesting Physician: Elijah Davis    Reason for Visit:   Chief Complaint   Patient presents with    Neck Pain    Low Back Pain         Review of Systems   Pain: Elijah Davis returned for acupuncture therapy. He states that he is still feeling pain and tightness in the neck and lower back bilaterally. He is feeling energetic and motivated. He feels that his pain levels have decreased overall. He states he is having minor pain and tightness in the lateral aspect of the legs as well.     Last treatment evaluation  Did last treatment help? some    Physical Exam  Na     Technique  Technique: Acupuncture      Treatment Points  Urinary Bladder: 13;15;18;20;23;50;52;28;29;30;31  Kidney: 3  Kidney Laterality: Bilateral  Gall Bladder: 20;21;34  Gall Bladder Laterality: Right  Du: 20;4    Diagnosis  TCM Dx: Spleen qi deficiency. qi and blood stasis in the ub and gb    Biomedical Assessment: The primary encounter diagnosis was Neck pain. A diagnosis of Bilateral low back pain, unspecified chronicity, with sciatica presence unspecified was also pertinent to this visit.    Plan: acupuncture as needed to manage pain     Supplemental Techniques:   acupuncture    Herbal Recommendations:   Na     Total face to face contact time with patient rendering acupuncture services as described above greater than 25 minutes. Services rendered above reflect acupuncture needles inserted for 15 minutes, plus an additional 15 minutes with reinsertion of needles at du 20

## 2017-10-08 ENCOUNTER — Encounter

## 2017-10-08 ENCOUNTER — Ambulatory Visit (INDEPENDENT_AMBULATORY_CARE_PROVIDER_SITE_OTHER): Payer: BC Managed Care – PPO

## 2017-10-08 DIAGNOSIS — R5382 Chronic fatigue, unspecified: Secondary | ICD-10-CM

## 2017-10-08 DIAGNOSIS — C9141 Hairy cell leukemia, in remission: Principal | ICD-10-CM

## 2017-10-08 NOTE — Patient Instructions (Addendum)
RECOMMENDATIONS:        (1) Whole food, plant-based dietbased on:    A. Whole grains   B. Vegetables  C. Beans &legumes  D. Fruit    (2) Congee(see below)    (3) Leafy greens:   Lightly cooked (steamed, par-boiled, sauteed) - greens should be wilted but bright green after cooking   Emphasize cabbage/mustard family greens - kale, collards, rapini, broccoli, cabbage, boy choy, mustard greens, turnip greens, arugula, etc.   Aim for at least 1-2 fist-sized servings per day   Add a few drops of vinegar and, if desired, a few drops of tamari or shoyu (naturally fermented soy sauce)   Chew well - 25x per mouthful, if possible    (4) Seaweed/sea vegetables:   Has anti-cancer properties (fucoidans)   Contains alginate, which helps bind and remove heavy metals   Source of EPA and DHA (anti-inflammatory omega-3's also found in small, wild-caught cold water fish)    (5) Integrative dietary counseling:   Denman George, RD   Mickel Fuchs, RD - ask her about coaching services, too    (6) [facility] for education and support in making dietary changes.   Next class: Sept 5 - 8, 2019   Web page: [email or web address]   For help or more info, contact Winnifred Friar:                                      Tel. [phone]              Email: [email or web address]    (7) Cookbook: "The Book of [employer]" by [employer]    (8) Read The [place] Study by Heath Gold and "N-of-1" by [employer]          __________________  BASIC CONGEE:      Ingredients:    * 1 cup brown rice or other intact kernel whole grain  * 4 to 12 cups water per cup of grain  * Pinch sea salt per cup of grain    Directions:    (1) Rinse rice in water a bowl, stirring/kneading rice to release dirt and/or particulate matter on surface. Drain cloudy water through [employer] and repeat 1 or 2 more times, until drained water runs clear.    (2) Place clean rice, water, and salt in saucepan (ideally  one with thick clad bottom to disperse heat and prevent burning on bottom) and bring to rapid boil over high heat.    (3) Turn heat to low, cover with lid, and slow cook until all water absorbed - typically an hour or more (varies depending on level of heat at low setting).      __________________________    Comments:    Congee is a soft, easily digested and highly nutritious porridge made from whole grains (most commonly brown rice but also barley, oats, Job's tears, millet, and others) in the intact kernel form (i.e. as unmilled groats).    Congee is used for patients who are convalescing from illness or have weakened digestion. A number of individuals have reported improvement in symptoms of bowel problems such as Crohns disease, ulcerative colitis, irritable bowel syndrome, constipation, and diarrhea.    Congee was first used in ancient [place] and later in other parts of [place] where physicians would use it as the first line of food as medicine for many diseases. They  would also often blend in healing herbs or foods thought to be specific for the patient's condition.    Congee should have the consistency of oatmeal. It can be prepared in even larger quantities (e.g. 2 cups whole grain, 8 cups water) or with an even larger proportion of water:grain (4:1, 6:1, even 8:1). According to Classical [place] medicine theory, the larger the proportion of water-to-grain, the more healing the congee. However, the trade-off is that it takes longer to cook.    Congee can be made using whole grains other than brown rice (e.g. Job's tears, millet, sweet brown rice) or a mixture of grains (e.g. 75:25 or 50:50 brown rice-to-Job's tears).    Congee can be prepared as a slightly sweet breakfast dish - for example, by cooking in raisins or other dried or fresh fruit and cinnamon during the last 30 minutes of cooking. Alternatively, it can be made as a savory dish for lunch or dinner (for example, by cooking in carrots, onions,  mushrooms, and ginger and then seasoning with tamari or miso and garnishing with minced scallions).

## 2017-10-09 ENCOUNTER — Ambulatory Visit (INDEPENDENT_AMBULATORY_CARE_PROVIDER_SITE_OTHER): Payer: BC Managed Care – PPO | Admitting: Acupuncturist

## 2017-10-09 DIAGNOSIS — M542 Cervicalgia: Principal | ICD-10-CM

## 2017-10-16 ENCOUNTER — Ambulatory Visit (INDEPENDENT_AMBULATORY_CARE_PROVIDER_SITE_OTHER): Payer: BC Managed Care – PPO | Admitting: Acupuncturist

## 2017-10-16 NOTE — Progress Notes (Signed)
CIM Acupuncture Note    October 09, 2017    Patient: Elijah Davis  Age: 32 year old   Sex: male    Treatment Number: 5    Requesting Physician: Self, Referred    Reason for Visit:   Chief Complaint   Patient presents with    Neck Pain    Shoulder Pain         Review of Systems   Pain: Ziaire reports he is doing well today. He reports minor pain in the neck and shoulders. In addition to tightness in the lower back and sacrum,. Overall he states he is feeling well, and feeling energetic. We will continue treatment to manage pain at this time.     Last treatment evaluation  Did last treatment help? some    Physical Exam  Na     Technique  Technique: Acupuncture      Treatment Points  Urinary Bladder: 13;15;18;20;23;50;52;28;29;30;31  Kidney: 3  Kidney Laterality: Bilateral  Gall Bladder: 20;21;34  Gall Bladder Laterality: Right  Du: 20;4    Diagnosis  TCM Dx: Spleen qi deficiency. qi and blood stasis in the ub and gb    Biomedical Assessment: The encounter diagnosis was Neck pain.    Plan: acupuncture as needed to manage pain     Supplemental Techniques:   acupuncture    Herbal Recommendations:   Na     Total face to face contact time with patient rendering acupuncture services as described above greater than 25 minutes. Services rendered above reflect acupuncture needles inserted as documented above for 15 minutes, plus an additional 15 minutes with reinsertion of needles at du 20

## 2017-10-21 ENCOUNTER — Ambulatory Visit (INDEPENDENT_AMBULATORY_CARE_PROVIDER_SITE_OTHER)

## 2017-10-22 ENCOUNTER — Encounter (INDEPENDENT_AMBULATORY_CARE_PROVIDER_SITE_OTHER): Payer: BC Managed Care – PPO

## 2017-10-22 ENCOUNTER — Encounter

## 2017-10-25 ENCOUNTER — Ambulatory Visit (INDEPENDENT_AMBULATORY_CARE_PROVIDER_SITE_OTHER): Payer: BC Managed Care – PPO

## 2017-10-25 DIAGNOSIS — M9901 Segmental and somatic dysfunction of cervical region: Secondary | ICD-10-CM

## 2017-10-25 DIAGNOSIS — R5383 Other fatigue: Principal | ICD-10-CM

## 2017-10-25 DIAGNOSIS — M9903 Segmental and somatic dysfunction of lumbar region: Secondary | ICD-10-CM

## 2017-10-25 DIAGNOSIS — M9900 Segmental and somatic dysfunction of head region: Secondary | ICD-10-CM

## 2017-10-25 DIAGNOSIS — M9905 Segmental and somatic dysfunction of pelvic region: Secondary | ICD-10-CM

## 2017-10-25 DIAGNOSIS — M9904 Segmental and somatic dysfunction of sacral region: Secondary | ICD-10-CM

## 2017-10-25 NOTE — Patient Instructions (Addendum)
HERE FOR FOLLOW UP REGARDING: wellness visit    Has met with Dr. Verner Chol and Mariea Stable.  Has been watching documentaries and books.  Has been working on core strength - doing yoga every day - astanga, bikram.  Working on tucking in [place]. Has ben doing weightlifiting, strength training.  Working on the balance.     Blood check every 6 wereks      OBJECTIVE     GEN: Appear/G-Abn(-) with distress   Posture: Cervical lordosis: increased __.decreased __. normal _x_.  Thoracic kyphosis:  increased __.decreased __. normal __x.   Lumbar lordosis: increased __.decreased __. normal __x.   Lateral gravitational line:  TART changes : Tissue texture changes ___x_  Asymmetry _x___ Restriction x____ Tenderness __x__ Cervical __x__ Thoracic ____  Ribs ____  Lumbar ___x_ Sacral __x__  UE ____  LE ____  ROM Decreased in: Cervical: Flexion ____  Extension ____ Sidebending __x__ Rotation ___x_ Thoracic: Flexion ____ Extension ____ Sidebending ____ Rotation ____ Lumbar: Flexion ____ Extension ____  Sidebending ____ Rotation ____     CRI rate: low __. med _x_. high __. CRI amplitude normal __. decreased __x. increased __. SBS strain pattern: normal __. torsion __. SB/rotation _x_. SBS compression __. lateral strain __. vertical __. flexion __. extension __.  Inspection: Tenderness in paraspinous muscles in: Lumbosacral ____ Cervical ____ Thoracic ____  Spasm in paraspinous muscles in: Lumbosacral __x__ Cervical ___x_ Thoracic ____  Hip: Gluteus medius/weak hip hike __. left __. right __. restriction in internal rotation __. restriction in external rotation _x_.    Compression: lumbosacral _x___ sacroiliac _x___ OA ___x_ Condylar _x___   Temporomandibular joint: Click __. Pop __. right __. left __. Deviation __. Tenderness __     NECK: NO TRACHEAL DEVIATION  HEENT: N/C, A/T, PERRLA, EOMI, CONJUNCTIVA WNL,  MUCOUS MEMBRANES MOIST  EARS: HEARING WNL  NOSE: EXTERNAL NARES WNL  LUNGS: NO RESPIRATORY DISTRESS, NO AUDIBLE WHEEZING, SPEAKING IN  FULL COMPLETE SENTENCES      ABD: SOFT, NON-DISTENDED   NEURO: NO GROSS MOTOR DEFICITS  PSYCH: PT ALERT AND ORIENTED WITH NORMAL MOOD AND AFFECT    ASSESSMENT  Cranial/Head somatic dysfunction 739.0__x__   Cervical somatic dysfunction 739.1__x__   Thoracic somatic dysfunction 739.2____   Rib somatic dysfunction 739.8____   Lumbar somatic dysfunction 739.3__x__   Sacral somatic dysfunction 739.4__x__   Pelvic somatic dysfunction 739.5__x__   Upper extremity somatic dysfunction 739.7____   Lower extremity somatic dysfunction 739.6____   Abd/Other Somatic Dysfunction 739.9____   Low Back Pain 724.2____   Neck Pain 723.1____   Foot pronation 736.79____     PLAN  Patient informed of risks and benefits of OMT, and patient/Guardian has consented to treatment.  Exercise handout given, demonstrated and discussed ____   Reviewed proper biomechanics and ergonomics ____   Nutritional advice given ____   OMT: S/CS ____ (to listed above tenderpoints) MFR ____ FPR ____ ME ____ ART ____ Ligamentous balancing ____ Diaphragm release ____ Decompression lumbosacral ____ Sacroiliac decompression ____ OA-Decompression ____ Condylar Decompression  ____ Cranial Osteopathy __x__ CV4 ____ Ven-Sin ____ Frontal lift ____ Parietal lift ____ Sphenopalatine ganglion release ____ Temporomandibular joint balancing ____   Treatment time: 13mn _x___ . 60 min ____ . 927m ____ . 12022m____ .  Post treatment pain level:   Improved ___x_ . Unchanged ____ . Worse ____ .  Treatment/Complex/Data: Minimal. ____ Limited. ____ Moderate. ____x Extensive____ .  Dx/Manage:  Minimal. __ Limited. __ Multiple. _x_ Extensive.   Return to the clinic  in 2 weeks____ ,  2 mo___x_ , PRN____   Return if problems develop or if worsens __x__

## 2017-10-29 NOTE — Progress Notes (Signed)
Please see original documentaition

## 2017-11-01 ENCOUNTER — Telehealth (INDEPENDENT_AMBULATORY_CARE_PROVIDER_SITE_OTHER): Payer: Self-pay | Admitting: Neurology

## 2017-11-04 ENCOUNTER — Encounter (INDEPENDENT_AMBULATORY_CARE_PROVIDER_SITE_OTHER): Admitting: Neurology

## 2017-11-05 ENCOUNTER — Encounter (INDEPENDENT_AMBULATORY_CARE_PROVIDER_SITE_OTHER): Payer: BC Managed Care – PPO

## 2017-11-05 ENCOUNTER — Ambulatory Visit (INDEPENDENT_AMBULATORY_CARE_PROVIDER_SITE_OTHER)

## 2017-11-05 ENCOUNTER — Encounter (INDEPENDENT_AMBULATORY_CARE_PROVIDER_SITE_OTHER): Payer: Self-pay

## 2017-11-05 VITALS — BP 128/87 | HR 91 | Ht 71.0 in | Wt 194.0 lb

## 2017-11-07 ENCOUNTER — Encounter (INDEPENDENT_AMBULATORY_CARE_PROVIDER_SITE_OTHER): Payer: Self-pay | Admitting: Family Practice

## 2017-11-07 NOTE — Progress Notes (Signed)
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 He seeks a natural, nutritional approach as an adjunct to the management of this condition.    At today's visit, RA said that in 2013, he developed profound fatigue, abdominal and back pain, weight loss, and night sweats. 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 He was treated with Cladribine and both his spleen size and marrow infiltration decreased considerably. However, he remained continuously fatigued and "mentally lethargic" on this treatment and was switched to Rituxan about a year later. The Rituxan didn't have much effect on the leukemia and he was switched to Dabrafenib 100 mg PO BID in April 2015. His symptoms, including energy and mood, improved and he gained weight. Evidently, however, he discontinued this treatment and again began experiencing a worsening of his CBC's (Jan 2018: WBC 2.6, Hb 16, Plt 63), intermittent abdominal pain, and progressive fatigue. In late Jan 2018, he saw Dr. Rick Duff ([facility]), was re-started on Dabrafenib (but at 75 mg BID), and has again experienced an improvement in his symptoms and CBC's. Since then, he has been doing meditation and yoga, trying to increase his physical activity, and now seeks guidance in dietary and other integrative strategies both to improve his health and decrease his long-term reliance on medication.      Past Medical History:   Diagnosis Date   . Hairy cell leukemia (CMS-HCC)        No past surgical history on file.      Allergies:  No Known Allergies      Medications:  Current Outpatient Medications on File Prior to Visit   Medication Sig Dispense Refill   .  buPROPion (WELLBUTRIN) 300 MG Extended-Release tablet 300 mg by Oral route.     . gabapentin (NEURONTIN) 600 MG tablet Take 600 mg by mouth as needed.     . levETIRAcetam (KEPPRA) 500 MG tablet 500 mg by Oral route 2 times daily.      . QUEtiapine (SEROQUEL) 100 MG tablet 100 mg by Oral route.       No current facility-administered medications on file prior to visit.        Family History   Problem Relation Name Age of Onset   . Breast Cancer Paternal Aunt     . Cancer Maternal Grandmother          brain tumor       Social History     Socioeconomic History   . Marital status: Single     Spouse name: Not on file   . Number of children: Not on file   . Years of education: Not on file   . Highest education level: Not on file   Occupational History   . Not on file   Social Needs   . Financial resource strain: Not on file   . Food insecurity:     Worry: Not on file     Inability: Not on file   . Transportation needs:     Medical: Not on file     Non-medical: Not on file   Tobacco Use   . Smoking status: Never Smoker   . Smokeless tobacco: Former Systems developer  Substance and Sexual Activity   . Alcohol use: No     Alcohol/week: 0.0 oz   . Drug use: No   . Sexual activity: Not on file   Lifestyle   . Physical activity:     Days per week: Not on file     Minutes per session: Not on file   . Stress: Not on file   Relationships   . Social connections:     Talks on phone: Not on file     Gets together: Not on file     Attends religious service: Not on file     Active member of club or organization: Not on file     Attends meetings of clubs or organizations: Not on file     Relationship status: Not on file   . Intimate partner violence:     Fear of current or ex partner: Not on file     Emotionally abused: Not on file     Physically abused: Not on file     Forced sexual activity: Not on file   Other Topics Concern   . Not on file   Social History Narrative   . Not on file       OBJECTIVE:    Physical Exam:  General: Awake, alert, well  oriented, and in no apparent distress.   HEENT: PERRLA. EOMI. Sclerae clear. Oropharynx clear.  Neck: Supple.   Abdomen: Non-distended.  Extremities: No clubbing, cyanosis, or edema.      ASSESSMENT AND PLAN:     Recommendations:    (1) Whole food, plant-based dietbased on:    A. Whole grains   B. Vegetables  C. Beans &legumes  D. Fruit    (2) Congee(instructions provided)    (3) Leafy greens:   Lightly cooked (steamed, par-boiled, sauteed) - greens should be wilted but bright green after cooking   Emphasize cabbage/mustard family greens - kale, collards, rapini, broccoli, cabbage, boy choy, mustard greens, turnip greens, arugula, etc.   Aim for at least 1-2 fist-sized servings per day   Add a few drops of vinegar and, if desired, a few drops of tamari or shoyu (naturally fermented soy sauce)   Chew well - 25x per mouthful, if possible    (4) Seaweed/sea vegetables:   Has anti-cancer properties (fucoidans)   Contains alginate, which helps bind and remove heavy metals   Source of EPA and DHA (anti-inflammatory omega-3's also found in small, wild-caught cold water fish)    (5) Integrative dietary counseling:   Denman George, RD   Mickel Fuchs, RD - ask her about coaching services, too    (6) [facility] for education and support in making dietary changes.    (7) Cookbook: "The Book of Whole Meals" by [employer]    (8) Read "The [place] Study" by Heath Gold and "N-of-1" by [employer]      Note: One hour and 50 minutes were spent with RA, over 50% of which was in face-to-face counseling and coordination of care. This time was required to review his health history, discuss his hairy cell leukemia and its management, and provide him with education about the role of integrative medicine, particularly dietary and lifestyle modification, as a part of his care.    Venetia Constable, MD

## 2017-11-08 ENCOUNTER — Ambulatory Visit (INDEPENDENT_AMBULATORY_CARE_PROVIDER_SITE_OTHER): Payer: BC Managed Care – PPO | Admitting: Family Practice

## 2017-11-08 ENCOUNTER — Encounter (INDEPENDENT_AMBULATORY_CARE_PROVIDER_SITE_OTHER): Payer: BC Managed Care – PPO

## 2017-11-08 ENCOUNTER — Encounter (INDEPENDENT_AMBULATORY_CARE_PROVIDER_SITE_OTHER): Payer: Self-pay | Admitting: Family Practice

## 2017-11-08 VITALS — BP 102/58 | HR 67 | Temp 98.2°F | Resp 16 | Ht 71.0 in | Wt 190.0 lb

## 2017-11-08 DIAGNOSIS — Z6826 Body mass index (BMI) 26.0-26.9, adult: Secondary | ICD-10-CM

## 2017-11-08 DIAGNOSIS — G479 Sleep disorder, unspecified: Secondary | ICD-10-CM

## 2017-11-08 DIAGNOSIS — C9141 Hairy cell leukemia, in remission: Secondary | ICD-10-CM

## 2017-11-08 DIAGNOSIS — R5382 Chronic fatigue, unspecified: Secondary | ICD-10-CM

## 2017-11-08 NOTE — Patient Instructions (Addendum)
Thank you Jenkins for coming in today, it was a true  pleasure seeing you again. Regarding our visit today, I want to sum up our discussion and recommendations I have:    Continue following the nutritional recommendations from Dr. Verner Chol. Keep up the great work on the healthy lifestyle changes.     I recommend discussing with Dr. Linus Mako about your upcoming trip to [place].    The next you see Dr. Linus Mako, you can consider exploring the idea of a cross taper of Seroquel and Gabapentin. I suspect that it might be healthier to rely on Gabapentin every night for the reasons we discussed.    I recommend taking Cordyceps every morning sometime before your first meal, as directed on the bottle.    I do not recommend kratom as there is not enough information on it yet.     Green tea in the morning and/or early afternoon can provide many health benefits.     We plan a return office visit here in about 2 months   Please call [phone], [facility] to schedule that.    Thank you,  Dr. Mariea Stable

## 2017-11-08 NOTE — Progress Notes (Signed)
INTEGRATIVE RETURN CONSULTATION    Referred by:  Self  Primary care physician:   Gavin Potters     SUBJECTIVE:  Elijah Davis is a 32 year old male who presents for an integrative visit, for help with Integrative Medicine     Known to me from our previous initial visit, Elijah Davis  has a past medical history of Hairy cell leukemia (CMS-HCC).   And he now seeks feedback regarding his diagnosis of Leukemia and guidance regarding natural methods he can employ to help improve his health and reduce the risk of relapse after treatment, if that should be successful. he seeks feedback regarding his conventional treatment options as well.   We have asked Elijah Davis to complete our integrative questionnaire.      Interval events since last visit with me:    1.  Hairy Cell Leukemia  Had OMT twice, states it feels more like Reiki than chiropractor adjustment, believes it is doing something but is not sure what, open to more treatments. Has also been also trying to mix up his physical activities.     Has been trying to add more community in his life, set up an hour to meet with his friends once a week for Tuesday afternoon tea. Works remotely. Has been trying to bring more structure into his life and being productive. His work has deadlines but is comfortable with them.     Tried to get an appointment with Elijah Davis but was told she was not available and was not accepting new patients.       2. Energy/Nutrition  Had a consult with Elijah Davis. Has been eating congee, cutting back significantly on meat, eating more veggies, feels his body is "running a lot cleaner." Also has been eating less in general, eating one large meal a day, one big salad with chicken on top. Limiting his eating window between 1pm-8pm. Feels more content and sleeps better. Energy levels are significantly better, more consistent, better endurance, able to focus more during yoga and meditation. Feeling pretty good. No new medications or supplements. Sees  everything he is doing as sustainable Occasionally feels there is something that is missing but.can't identify anything that is off.  Also we had discussed mushrooms for focus, he has bought a supplement of cordyceps but did not start yet.     Has 1-2 cups of coffee pretty regularly, "gives him energy in the morning." Started drinking tea 3 weeks ago, trying different kinds.     Insurance does not cover a nutritionist, but plans to talk to his future sister in law who is a Engineer, maintenance (IT).      3. Sleep  Works from 7:30-10pm, goes to bed 10:30-11pm, sleeps through the night, wakes around 7:30-8am. His sleep schedule is consistent. Never has had a problem with waking up in the night.   Takes Seroquel to help him fall asleep at night, takes 50mg  and if he can't fall alseep an hour later he takes another 50mg , takes a second dose about 30% at a time. On days he has a lot of physical activity he can fall sleep without Seroquel.     4.  Mood  Sees his PCP, Elijah Davis once a month, has been working mostly with him. Manages his medication. Would like to be completely med free.    Takes wellbutrin for mood and energy.  Has considered a high dose 450mg  wellbutrin for his energy drop after chemo treatments.   Takes gabapentin for severe anxiety,  takes it rarely.     Was previously taking tianeptine supplements, an antidepressant sold in [place] that isn't regulated here. Learned the pharmacology was simlar to an opoid/benzodiazepine, weaned off of it too fast, had withdrawal symptoms and thinks he had a withdrawal seizure. Last seizure was in February 2019. Managed by Elijah Davis.   Had an MRI 'subtle patchy signal alteration involving tectum and perioperative ductal gray matter of midbrain;' and had a followup MRI and an EEG recently, told was stable. Was taking Keppra for loss of consciousness, was told the other day he can start tapering off Keppra over 3-4 weeks.  He was initially on depakote and wellbutrin and Kepra in the  beginning and was taken off depakote.     Has question about kratom. Tried it a long time ago.     Not using CBD currently, cautious because there is not enough information on it yet.       Social History, Inclusive:   Works in Technical sales engineer.   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       Diet:  Overall approach =     Caffeine:   Water:   Eating Habits:     Exercise:     Sleep:  As above    Social Support:     Health Practices:     Supplements: (in addition to prescribed medications)     Prior CAM use:      Restorative Practices:     Emotional and Spiritual Health:     General Emotional State:   Spiritual Practices:     High Risk Behaviors:  None; See Below for habits as in EPIC fields       Outpatient Medications Marked as Taking for the 11/08/17 encounter (Integrative Medicine) with Elijah Forehand, MD   Medication Sig Dispense Refill   . buPROPion (WELLBUTRIN) 300 MG Extended-Release tablet 300 mg by Oral route.     . gabapentin (NEURONTIN) 600 MG tablet Take 600 mg by mouth as needed.     . levETIRAcetam (KEPPRA) 500 MG tablet 500 mg by Oral route 2 times daily.      . QUEtiapine (SEROQUEL) 100 MG tablet 100 mg by Oral route.          Allergies to medications:  Patient has no known allergies.  Past Medical History:   Diagnosis Date   . Hairy cell leukemia (CMS-HCC)      No past surgical history on file.  Family History   Problem Relation Name Age of Onset   . Breast Cancer Paternal Aunt     . Cancer Maternal Grandmother          brain tumor     Social History     Socioeconomic History   . Marital status: Single     Spouse name: Not on file   . Number of children: Not on file   . Years of education: Not on file   . Highest education level: Not on file   Occupational History   . Not on file   Social Needs   . Financial resource strain: Not on file   . Food insecurity:     Worry: Not on file     Inability: Not on file   .  Transportation needs:     Medical: Not on file     Non-medical: Not on file   Tobacco Use   .  Smoking status: Never Smoker   . Smokeless tobacco: Former Chief Strategy Officer and Sexual Activity   . Alcohol use: No     Alcohol/week: 0.0 oz   . Drug use: No   . Sexual activity: Not on file   Lifestyle   . Physical activity:     Days per week: Not on file     Minutes per session: Not on file   . Stress: Not on file   Relationships   . Social connections:     Talks on phone: Not on file     Gets together: Not on file     Attends religious service: Not on file     Active member of club or organization: Not on file     Attends meetings of clubs or organizations: Not on file     Relationship status: Not on file   . Intimate partner violence:     Fear of current or ex partner: Not on file     Emotionally abused: Not on file     Physically abused: Not on file     Forced sexual activity: Not on file   Other Topics Concern   . Not on file   Social History Narrative   . Not on file     I did review the available past medical, family, and social history from the electronic records.     OBJECTIVE:    Wt Readings from Last 12 Encounters:   11/05/17 88 kg (194 lb)   09/27/17 88.5 kg (195 lb)   09/26/17 88.6 kg (195 lb 6.4 oz)   04/05/16 86.8 kg (191 lb 4.8 oz)   03/30/16 82.1 kg (181 lb)   01/04/15 82.3 kg (181 lb 7 oz)   06/29/14 82.1 kg (180 lb 14.4 oz)   03/30/14 84.2 kg (185 lb 9 oz)   03/02/14 82.6 kg (182 lb 1.6 oz)        BP 102/58   Pulse 67   Temp 98.2 F (36.8 C)   Resp 16   Ht 5\' 11"  (1.803 m)   Wt 86.2 kg (190 lb)   SpO2 98%   BMI 26.50 kg/m  Body mass index is 26.5 kg/m.  Well Developed, Well nourished male in No Acute Distress  Mood & Affect are normal  Head: normocephalic, atraumatic  Eyes: anicteric OU  Neuro: Oriented, alert, cooperative, Gait: normal  Skin: exposed areas free of rash and no significant lesions    pertinent Labs & Imaging: reviewed    08/14/17 MRI Brain W/WO Contrast  CONCLUSION:  1. Stable  approximately 7 mm subtle area of increased T2 and FLAIR weighted signal in the periaqueductal gray matter of the midbrain suspicious for low grade infiltrating neoplasm.  2. Mild Chiari one malformation, stable as well.    ASSESSMENT & PLAN:  Encounter Diagnoses   Name Primary?   . Hairy cell leukemia, in remission (CMS-HCC) Yes   . Chronic fatigue    . Sleep disturbances    . BMI 26.0-26.9,adult        Kratom strongly discouraged    Time spent: Over 50% of this 65 minute visit was devoted to integrative coordination of care, counseling and answering patient's questions and concerns about the diagnoses listed above. Also reviewed likely alternative diagnoses, potential diagnostic tests, and treatment considerations for those diagnoses. I educated him about self care as well.     No orders of the defined types were placed in this encounter.  Return in about 2 months (around 01/08/2018) for return CIM .    Patient Instructions   Thank you Elijah Davis for coming in today, it was a true  pleasure seeing you again. Regarding our visit today, I want to sum up our discussion and recommendations I have:    Continue following the nutritional recommendations from Elijah Davis. Keep up the great work on the healthy lifestyle changes.     I recommend discussing with Elijah Davis about your upcoming trip to [place].    The next you see Elijah Davis, you can consider exploring the idea of a cross taper of Seroquel and Gabapentin. I suspect that it might be healthier to rely on Gabapentin every night for the reasons we discussed.    I recommend taking Cordyceps every morning sometime before your first meal, as directed on the bottle.    I do not recommend kratom as there is not enough information on it yet.     Green tea in the morning and/or early afternoon can provide many health benefits.     We plan a return office visit here in about 2 months   Please call [phone], [facility] to schedule that.    Thank you,  Dr.  Mariea Stable      Diagnosis, available results of Labs/tests (if any) reviewed with patient and any questions answered.  Barriers to Learning assessed: none. Patient verbalizes understanding of teaching and instructions.    Relevant goal-setting conducted today for above condition(s).    See follow up as entered into the EPIC field  Electronically signed by  Elijah Earing MD    I am personally taking down the notes in the presence of Dr. Mariea Stable MD. -- Elijah Davis, 11/08/2017 9:45 AM      I, Dr. Gabriel Davis, personally performed the services described in this documentation, as scribed by Elijah Davis in my presence, and it is both accurate and complete. 11/08/17

## 2017-11-12 ENCOUNTER — Encounter (INDEPENDENT_AMBULATORY_CARE_PROVIDER_SITE_OTHER): Payer: BC Managed Care – PPO

## 2017-11-29 ENCOUNTER — Encounter (INDEPENDENT_AMBULATORY_CARE_PROVIDER_SITE_OTHER): Payer: BC Managed Care – PPO

## 2017-12-13 ENCOUNTER — Encounter (INDEPENDENT_AMBULATORY_CARE_PROVIDER_SITE_OTHER): Payer: BC Managed Care – PPO

## 2017-12-27 ENCOUNTER — Encounter (INDEPENDENT_AMBULATORY_CARE_PROVIDER_SITE_OTHER): Payer: BC Managed Care – PPO

## 2018-01-03 ENCOUNTER — Encounter (INDEPENDENT_AMBULATORY_CARE_PROVIDER_SITE_OTHER): Payer: BC Managed Care – PPO | Admitting: Family Practice

## 2018-01-10 ENCOUNTER — Ambulatory Visit (INDEPENDENT_AMBULATORY_CARE_PROVIDER_SITE_OTHER): Payer: BC Managed Care – PPO | Admitting: Family Medicine

## 2018-01-10 ENCOUNTER — Telehealth (INDEPENDENT_AMBULATORY_CARE_PROVIDER_SITE_OTHER): Payer: Self-pay | Admitting: Family Medicine

## 2018-01-10 ENCOUNTER — Encounter (INDEPENDENT_AMBULATORY_CARE_PROVIDER_SITE_OTHER): Payer: Self-pay | Admitting: Family Medicine

## 2018-01-10 VITALS — BP 130/82 | HR 102 | Temp 97.6°F | Ht 70.98 in | Wt 187.0 lb

## 2018-01-10 DIAGNOSIS — Z1389 Encounter for screening for other disorder: Secondary | ICD-10-CM

## 2018-01-10 DIAGNOSIS — Z1339 Encounter for screening examination for other mental health and behavioral disorders: Secondary | ICD-10-CM

## 2018-01-10 DIAGNOSIS — J339 Nasal polyp, unspecified: Secondary | ICD-10-CM

## 2018-01-10 DIAGNOSIS — Z1331 Encounter for screening for depression: Principal | ICD-10-CM

## 2018-01-10 NOTE — Progress Notes (Signed)
SUBJECTIVE:    1. Diet: Has seen Dr. Verner Chol talk about "ayurvedic". Approaches within that modality.     2. Is looking for different approaches to Hairy Cell Leukemia. Braf/MEK inhibitor taking when needed per CBCs. Not taking anything now.     3. Experiences a lot of fatigue. Reports this is his biggest symptom. Has tried stimulants in the past, found them to be "too much" (adderall, vyvanse, modafinil). Tries to get once monthly massages. Yoga 4-5xs per week. Weight lifting 1-2 xs per week. Lots of hiking. Has tried some herbs, nothing really helped. GOes to bed 10:30-11pm wakes up at 730-8am.     4. Lower back pain. Used to be on pain medications but would like to avoid this. Interested in looking into underlying causes. Acupuncture helps with the pain and anxiety a little bit.

## 2018-01-10 NOTE — Telephone Encounter (Signed)
Pt running a few min late to his 820 am apt. ETA 825 per gps

## 2018-01-10 NOTE — Progress Notes (Signed)
Elijah Davis is a 32 year old male who comes in to discuss options for IM visit.     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  Living college student life  Worked in [place], [place] - high stress/80h weeks.... 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     Wants to look at underlying, unsettling in the body - wants to take care of it    Yoga 5 days/week, hiking, - went on 3 month hiking trip, other trips  Weights      I reviewed her PMH/PSH/FH with the patient during the visit in the chart, and after the visit in CIM questionnaire.      Social History:     Diet: Congee, has gained weight during treatment, has done keto x several months, leaning towards more veggies, less meat, has meat in diet all the time, trying to reduce it.  Cooks in for hiself fresh organic vegetables, meat, grain - any meat.  Salmon heavy. Keto upped energy level.  Yoga/spin classes was going hard during that time. Setting goals. 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  Leaves flexibility.     Caffeine: coffee occasionally, little soda - cold pepsi, 1-2/week    Water: maybe    Eating Habits: focuses on food    High Risk Behaviors: rare, MJ - helps helps for slep, anxiety, CBD/THC 2/week,   Exercise: yoga - bikram/vinyasa power    Sleep: snoring more, recently, in L nostril - when symptoms of leukemia started to show up huge nose bleeds - spot in past -    Social Support: roomates, good no family,      Health Practices:   Supplements:    Prior CAM use: several, see below.    Restorative Practices: meditation.  Emotional and Spiritual Health:   General Emotional State: fair   Spiritual Practices:      Meditation 30/30 - diffuse oils,  salt lamp on, sound healing - [place] -    Opera performance major, plays many instruments - percussion   Harpist - guitar/sound healing    Gabapentin- 877m tablets, GAD - if has big PCP -   Seroquel - for sleep - 50- 100  (Girlfriend takes 3086m  [employer] weekly events, insight circles, processing emotions    OBJECTIVE:  BP 130/82 (BP Location: Left arm, BP Patient Position: Sitting, BP cuff size: Regular)   Pulse 102   Temp 97.6 F (36.4 C)   Ht 5' 10.98" (1.803 m)   Wt 84.8 kg (187 lb)   SpO2 100%   BMI 26.09 kg/m   General Appearance: healthy, alert, no distress, pleasant affect, cooperative.  Heart:  normal rate and regular rhythm, no murmurs, clicks, or gallops.  Lungs: lungs clear to auscultation.  Mental Status: Attitude: pleasant and lethargic   Behavior :normal  Eye Contact: normal  Attention Span: good  Speech: normal volume, rate, and pitch  Mood (pt's report) :negative  Affect: flat  Suicidal Ideation: no  Homicidal Ideation: no  Thought Process: logical and goal directed  Thought Content: no thought disorder  or psychotic symptoms  Memory: good  Judgment: good    Past Medical History:   Diagnosis Date   . Hairy cell leukemia (CMS-HCC)      No past surgical history on file..  Social History     Tobacco Use   . Smoking status: Never Smoker   . Smokeless tobacco: Former Chief Strategy [employer] Use Topics   . Alcohol use: No     Alcohol/week: 0.0 standard drinks   . Drug use: No     Family History   Problem Relation Name Age of Onset   . Breast Cancer Paternal Aunt     . Cancer Maternal Grandmother          brain tumor       ICD-10-CM ICD-9-CM    1. Screened negative for depression Z13.31 V79.0    2. Screened negative for alcohol use Z13.39 V79.1    3. Screened negative for drug use Z13.89 V82.9    4. 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 Octavia Bruckner   - great program to follow   - start with moving meals to biggest at  midday.   - drink plenty of water   - here is kitchari recipe: [email or web address]    Let me know by mychart how you are doing, and I'm happy to see you again in 2-3 months!    Great article on Vata: [email or web address]      Let me know if you would like to pursue the sleep study.         Greater than 50% of visit spent in counseling on the above and I spent 60 minutes with this pt.

## 2018-01-10 NOTE — Patient Instructions (Addendum)
[facility] - by Dr. Octavia Bruckner   - great program to follow   - start with moving meals to biggest at midday.   - drink plenty of water   - here is kitchari recipe: [email or web address]    Let me know by mychart how you are doing, and I'm happy to see you again in 2-3 months!    Great article on Vata: [email or web address]      Let me know if you would like to pursue the sleep study.

## 2018-01-15 ENCOUNTER — Encounter (INDEPENDENT_AMBULATORY_CARE_PROVIDER_SITE_OTHER): Payer: Self-pay

## 2018-02-17 ENCOUNTER — Encounter (INDEPENDENT_AMBULATORY_CARE_PROVIDER_SITE_OTHER): Payer: Self-pay | Admitting: Neurology

## 2018-02-17 ENCOUNTER — Ambulatory Visit (INDEPENDENT_AMBULATORY_CARE_PROVIDER_SITE_OTHER): Admitting: Neurology

## 2018-02-17 ENCOUNTER — Encounter

## 2018-02-17 VITALS — BP 145/84 | HR 94 | Ht 70.0 in | Wt 177.0 lb

## 2018-02-17 DIAGNOSIS — R9082 White matter disease, unspecified: Principal | ICD-10-CM

## 2018-02-17 DIAGNOSIS — R4020 Unspecified coma: Secondary | ICD-10-CM

## 2018-02-21 ENCOUNTER — Encounter (INDEPENDENT_AMBULATORY_CARE_PROVIDER_SITE_OTHER): Payer: Self-pay | Admitting: Hospital

## 2018-03-20 ENCOUNTER — Ambulatory Visit (INDEPENDENT_AMBULATORY_CARE_PROVIDER_SITE_OTHER): Payer: BC Managed Care – PPO | Admitting: Family Medicine

## 2018-03-24 ENCOUNTER — Encounter (INDEPENDENT_AMBULATORY_CARE_PROVIDER_SITE_OTHER): Admitting: Neurology

## 2018-04-08 ENCOUNTER — Encounter (INDEPENDENT_AMBULATORY_CARE_PROVIDER_SITE_OTHER): Payer: Self-pay | Admitting: Neurology

## 2018-04-10 ENCOUNTER — Telehealth (INDEPENDENT_AMBULATORY_CARE_PROVIDER_SITE_OTHER): Payer: Self-pay | Admitting: Neurology

## 2018-04-17 ENCOUNTER — Encounter (INDEPENDENT_AMBULATORY_CARE_PROVIDER_SITE_OTHER): Payer: Self-pay | Admitting: Neurology

## 2018-04-29 ENCOUNTER — Telehealth (INDEPENDENT_AMBULATORY_CARE_PROVIDER_SITE_OTHER): Payer: Self-pay | Admitting: Neurology

## 2018-06-06 ENCOUNTER — Encounter (INDEPENDENT_AMBULATORY_CARE_PROVIDER_SITE_OTHER): Payer: Self-pay | Admitting: Neurology

## 2018-06-19 ENCOUNTER — Encounter (INDEPENDENT_AMBULATORY_CARE_PROVIDER_SITE_OTHER): Admitting: Neurology

## 2018-08-14 ENCOUNTER — Encounter (INDEPENDENT_AMBULATORY_CARE_PROVIDER_SITE_OTHER): Payer: Self-pay

## 2018-08-15 ENCOUNTER — Telehealth (INDEPENDENT_AMBULATORY_CARE_PROVIDER_SITE_OTHER): Admitting: Medical

## 2018-08-18 ENCOUNTER — Encounter (INDEPENDENT_AMBULATORY_CARE_PROVIDER_SITE_OTHER): Payer: Self-pay | Admitting: Medical

## 2018-08-19 ENCOUNTER — Telehealth (INDEPENDENT_AMBULATORY_CARE_PROVIDER_SITE_OTHER): Payer: Self-pay | Admitting: Medical

## 2018-08-20 ENCOUNTER — Encounter (INDEPENDENT_AMBULATORY_CARE_PROVIDER_SITE_OTHER): Payer: Self-pay | Admitting: Medical

## 2019-04-03 ENCOUNTER — Encounter: Payer: Self-pay | Admitting: Hospital

## 2019-05-29 ENCOUNTER — Encounter (INDEPENDENT_AMBULATORY_CARE_PROVIDER_SITE_OTHER): Payer: Self-pay | Admitting: Hospital

## 2019-06-04 ENCOUNTER — Encounter (INDEPENDENT_AMBULATORY_CARE_PROVIDER_SITE_OTHER): Payer: Self-pay

## 2019-06-04 ENCOUNTER — Encounter (INDEPENDENT_AMBULATORY_CARE_PROVIDER_SITE_OTHER): Payer: Self-pay | Admitting: Hospital

## 2021-01-19 IMAGING — MR MRI SHOULDER RT W/O CONTRAST
5 series · 40 of 40 positions shown · non-contrast
Comparison: none

﻿

Pertinent Hx:  Multiple dislocations.
TECHNIQUE: Proton density and T2-weighted coronal oblique images of the shoulder were taken.  Proton density sagittal oblique and axial images were also obtained.

[Series 3: PD fat-sat · axial · 4.0mm · 0.59mm/px · z∈[+4,+109]mm · 10 of 23 slices shown (1 of 3)]
[im 1/23]
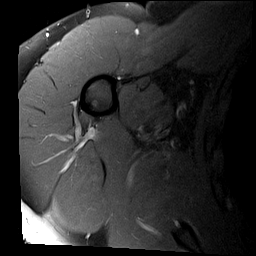
[im 3/23]
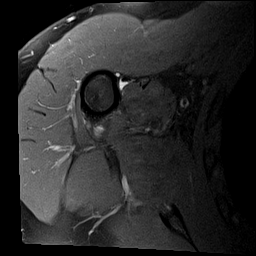
[im 5/23]
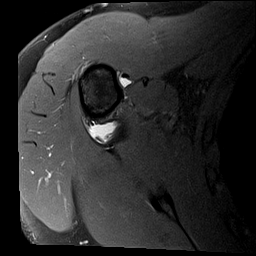
[im 8/23]
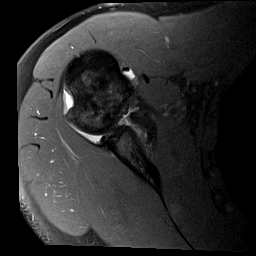
[im 10/23]
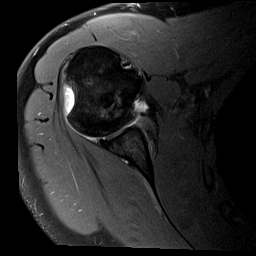
[im 13/23]
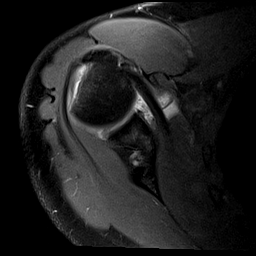
[im 15/23]
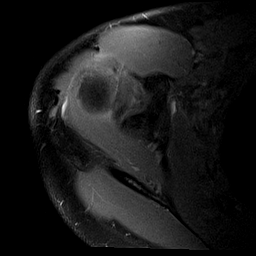
[im 18/23]
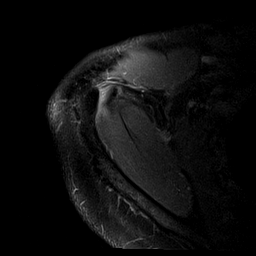
[im 20/23]
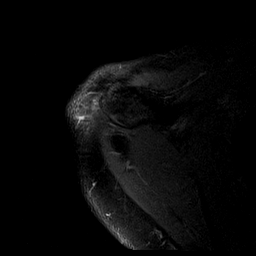
[im 23/23]
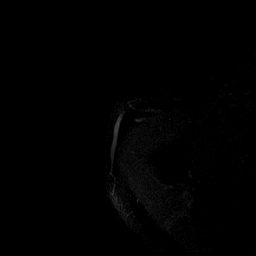

[Series 4: PD fat-sat · sagittal · 4.5mm · 0.59mm/px · 7 of 19 slices shown (2 of 3)]
[im 1/19]
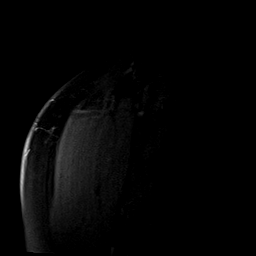
[im 4/19]
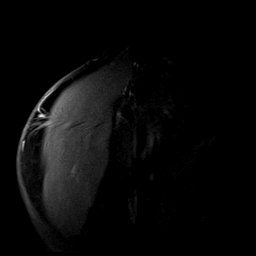
[im 7/19]
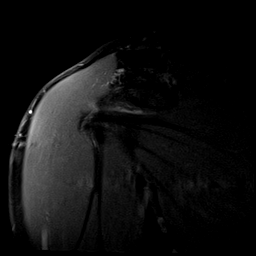
[im 10/19]
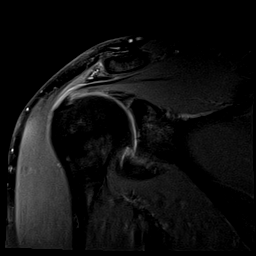
[im 13/19]
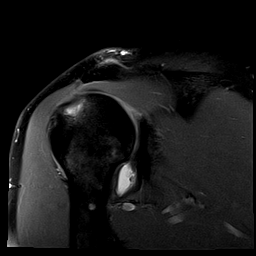
[im 16/19]
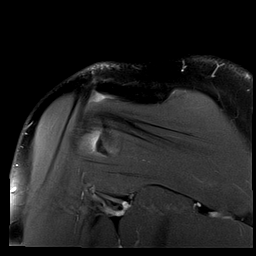
[im 19/19]
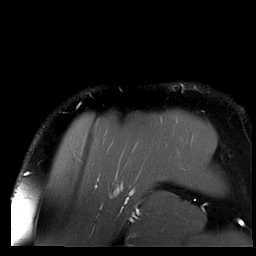

[Series 5: T2 · sagittal · 4.5mm · 0.59mm/px · 7 of 19 slices shown (1 of 2)]
[im 1/19]
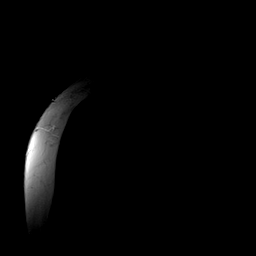
[im 4/19]
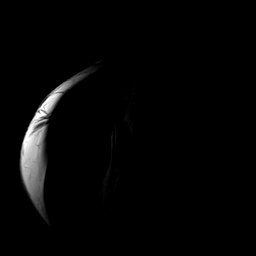
[im 7/19]
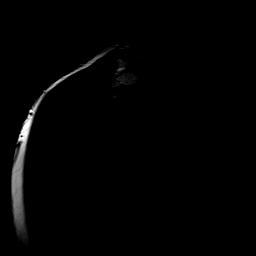
[im 10/19]
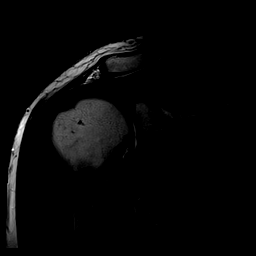
[im 13/19]
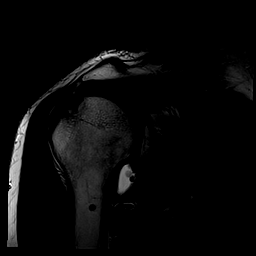
[im 16/19]
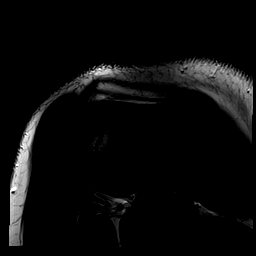
[im 19/19]
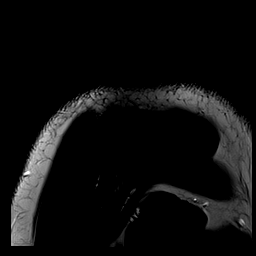

[Series 6: PD fat-sat · oblique · 4.5mm · 0.59mm/px · 7 of 19 slices shown (3 of 3)]
[im 1/19]
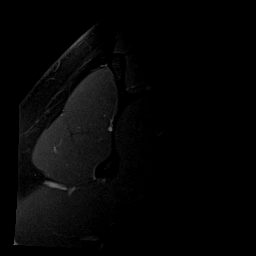
[im 4/19]
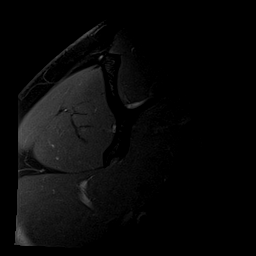
[im 7/19]
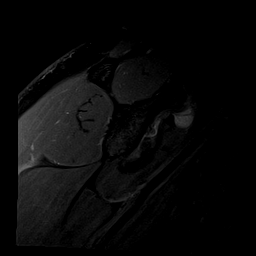
[im 10/19]
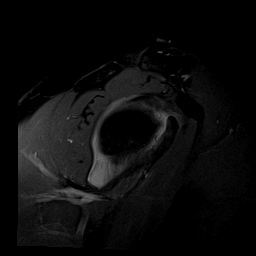
[im 13/19]
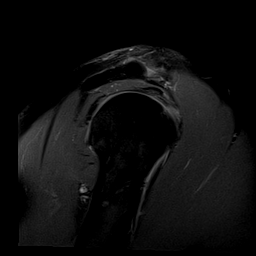
[im 16/19]
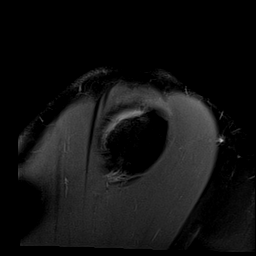
[im 19/19]
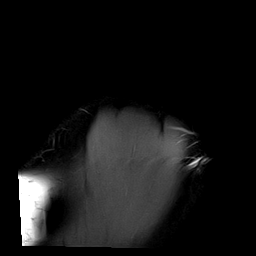

[Series 7: T2 · axial · 4.0mm · 0.59mm/px · z∈[+4,+109]mm · 9 of 23 slices shown (2 of 2)]
[im 1/23]
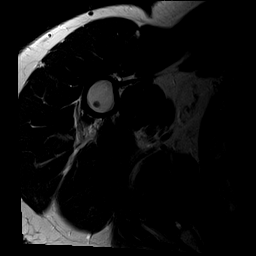
[im 3/23]
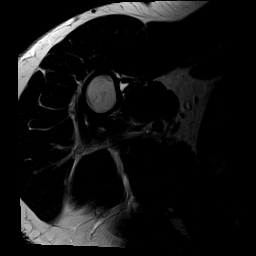
[im 6/23]
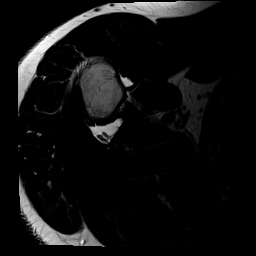
[im 9/23]
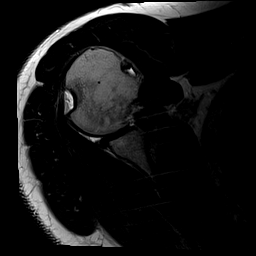
[im 12/23]
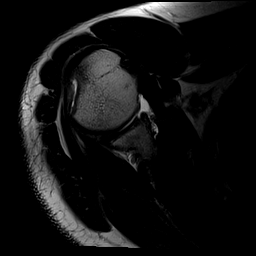
[im 14/23]
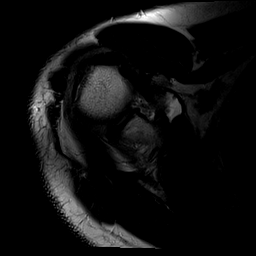
[im 17/23]
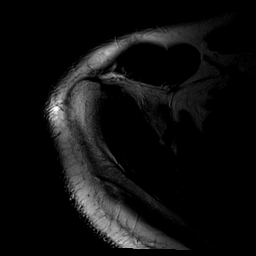
[im 20/23]
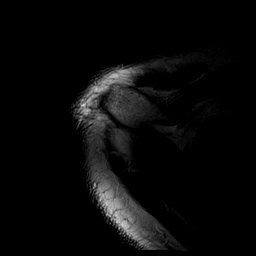
[im 23/23]
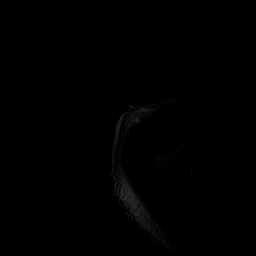

[40 of 40 positions shown; findings below may reference images not displayed]

FINDINGS: There is a large shoulder joint effusion.  

There is a small amount of fluid in the subacromial-subdeltoid bursa.  

There is a large, depressed segment on the humeral head posterolaterally consistent with a Hill-Sachs lesion.  There is no underlying bone marrow edema.  

A Bankart lesion is present.  The middle and inferior portions of the anterior glenoid labrum are torn and displaced medially-inferiorly.  This along with the Hill-Sachs lesion is consistent with prior anterior shoulder dislocation.  

There is a chondral defect on the bony glenoid anteriorly close to the labral detachment and displacement.  This measures about 1 cm across.  There are small underlying bony cystic changes.  

Within the shoulder joint capsule in the axillary recess and surrounded by joint fluid are several loose pieces of tissue.  These are probably torn pieces of labrum or articular cartilage.  A few small loose pieces are also present near the Hill-Sachs lesion.  

No other labral tear can be identified.  

No rotator cuff tendon tear can be identified.  

No muscular abnormality identified.  

There are mild acromioclavicular joint degenerative changes.  

Glenohumeral joint alignment is normal.
IMPRESSION: 1. Evidence of previous anterior shoulder dislocation.  Hill-Sachs lesion is present on the humeral head posterolaterally with a large, depressed segment.  A Bankart lesion is present with a tear of the anterior glenoid labrum which is detached and displaced.  

2. Chondral defect on the bony glenoid anteriorly.  

3. Large shoulder joint effusion.  Multiple loose pieces of tissue within the shoulder joint capsule. 

4. No rotator cuff tendon tear.

## 2021-09-15 IMAGING — MR MRI BRAIN WITHOUT AND WITH CONTRAST
7 of 11 series · 30 of 48 positions shown · IV contrast (prohance)
Comparison: none

------------- REPORT [id number] -------------
﻿Baptiste, Tinh;

Sincerely,
------------- REPORT [id number] -------------
﻿
Pertinent Hx:    Syncope.  History of leukemia.
TECHNIQUE: T1, T2, and FLAIR weighted axial images of the brain were performed along with T2 weighted coronal images. Additional diffusion weighted images were performed.  20 mL Prohance is administered and T1 weighted axials, coronals, and sagittals are performed.

[Series 2: T1 · sagittal · 5.0mm · 0.47mm/px · 5 of 24 slices shown (1 of 3)]
[im 1/24]
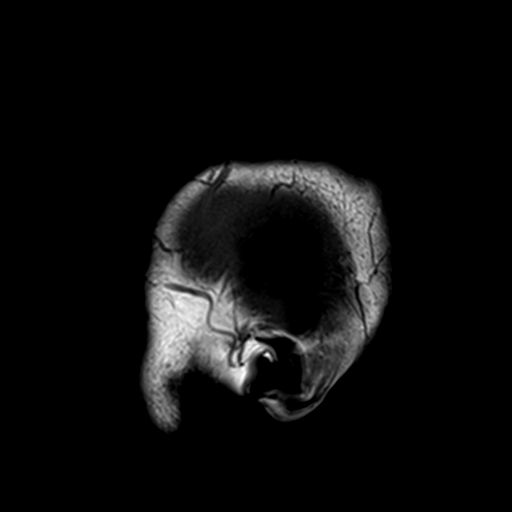
[im 6/24]
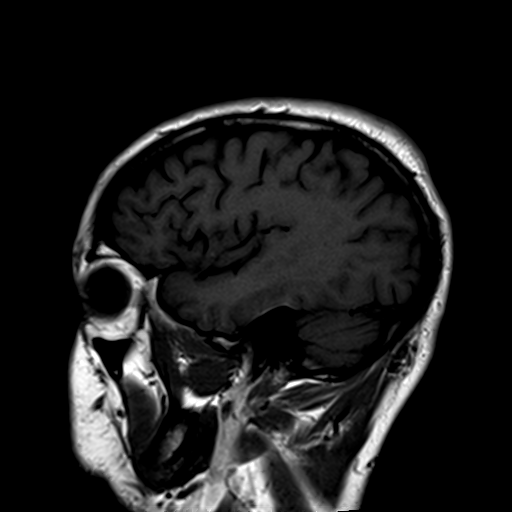
[im 12/24]
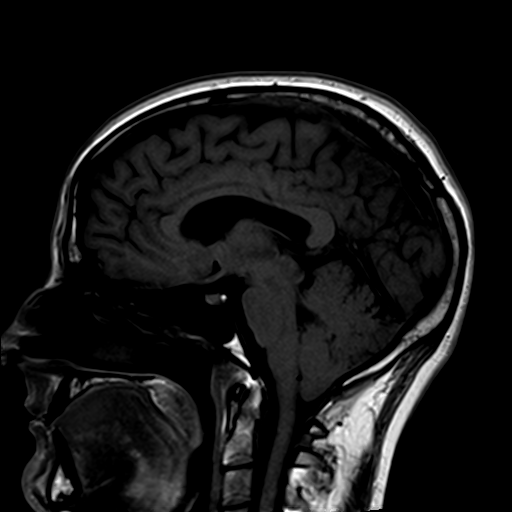
[im 18/24]
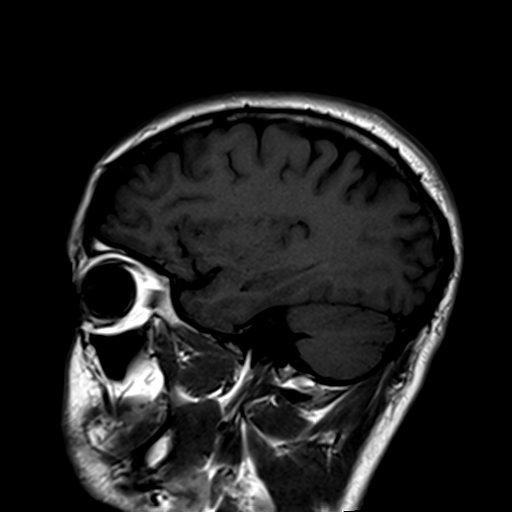
[im 24/24]
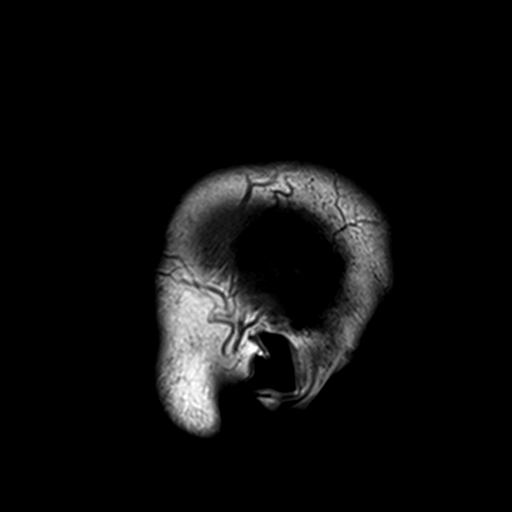

[Series 3: T2 · coronal · 5.0mm · 0.75mm/px · 6 of 29 slices shown (1 of 2)]
[im 1/29]
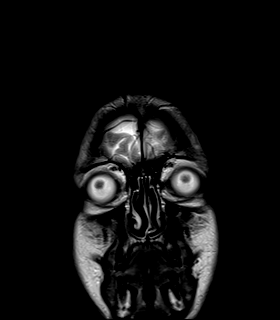
[im 6/29]
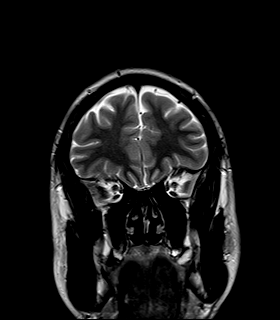
[im 12/29]
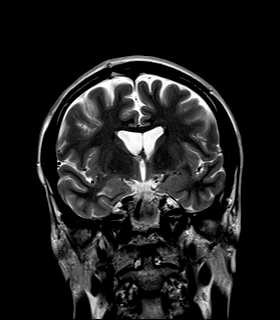
[im 17/29]
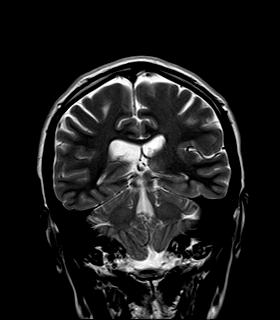
[im 23/29]
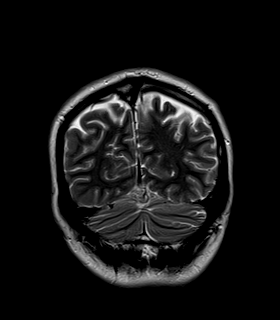
[im 29/29]
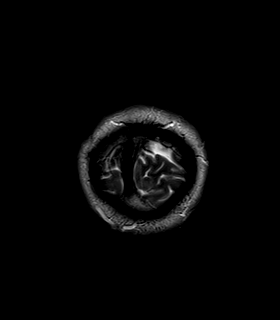

[Series 4: T2 · axial · 5.0mm · 0.75mm/px · z∈[-82,+55]mm · 4 of 26 slices shown (2 of 2)]
[im 1/26]
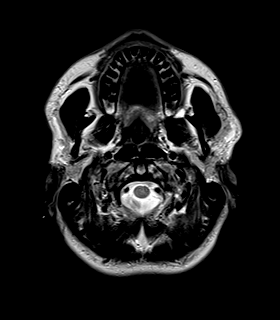
[im 9/26]
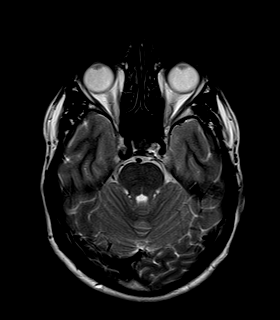
[im 17/26]
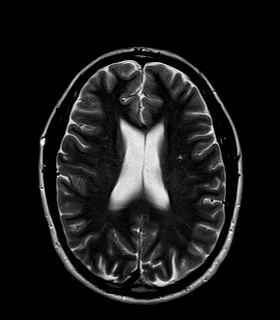
[im 26/26]
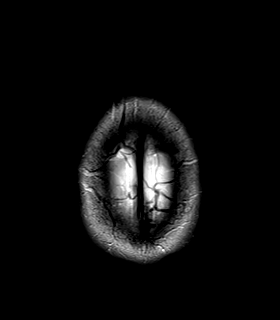

[Series 5: T1 · axial · 5.0mm · 0.94mm/px · z∈[-82,+55]mm · 4 of 26 slices shown (2 of 3)]
[im 1/26]
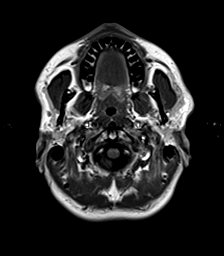
[im 9/26]
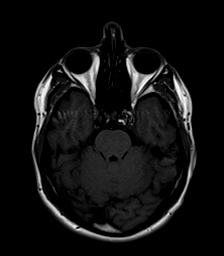
[im 17/26]
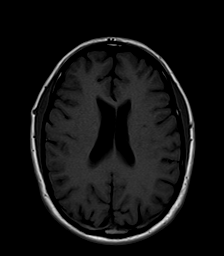
[im 26/26]
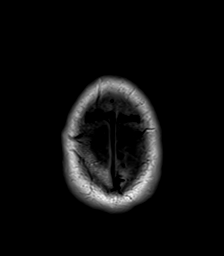

[Series 6: FLAIR · axial · 5.0mm · 0.47mm/px · z∈[-82,+55]mm · 4 of 26 slices shown]
[im 1/26]
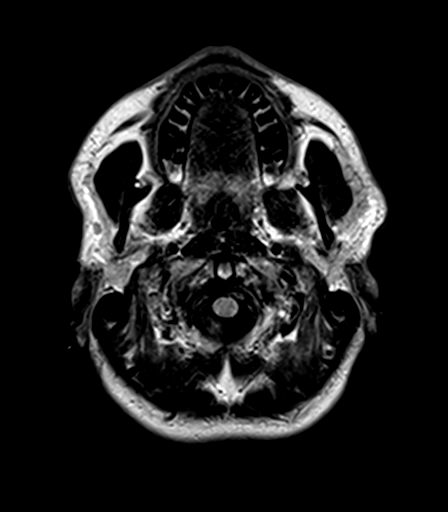
[im 9/26]
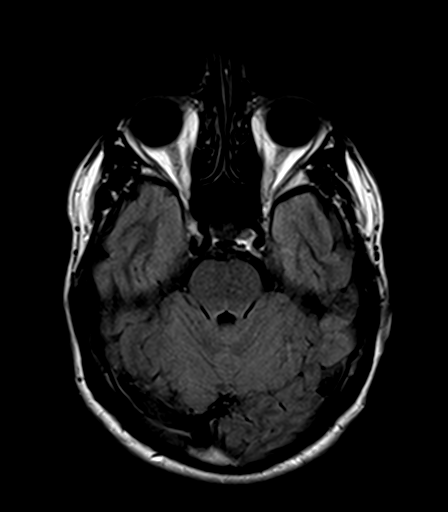
[im 17/26]
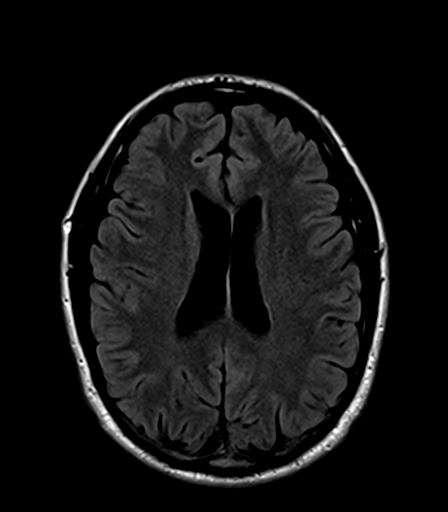
[im 26/26]
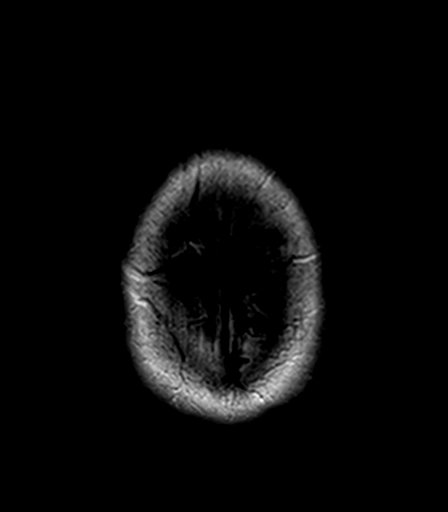

[Series 7: GRE · axial · 5.0mm · 0.47mm/px · z∈[-82,+55]mm · 4 of 26 slices shown]
[im 1/26]
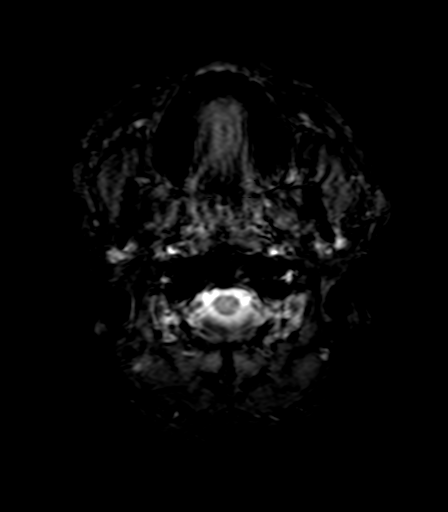
[im 9/26]
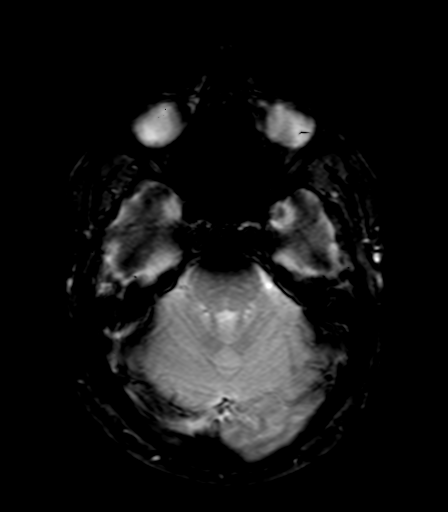
[im 17/26]
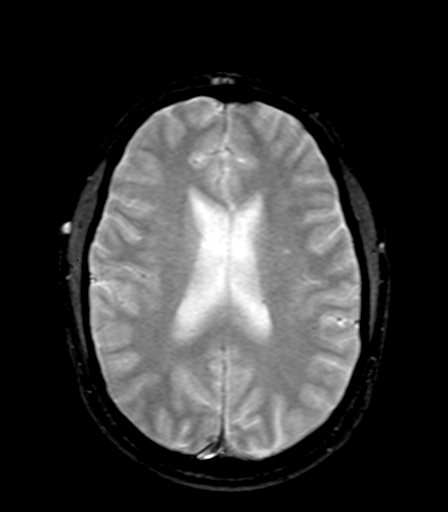
[im 26/26]
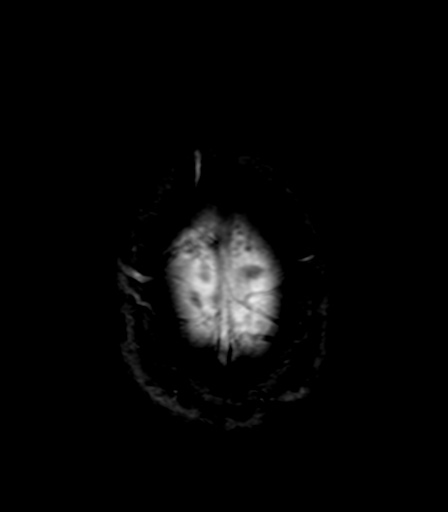

[Series 10: T1 · axial · 5.0mm · 0.47mm/px · z∈[-82,+6]mm · 3 of 26 slices shown (3 of 3)]
[im 1/26]
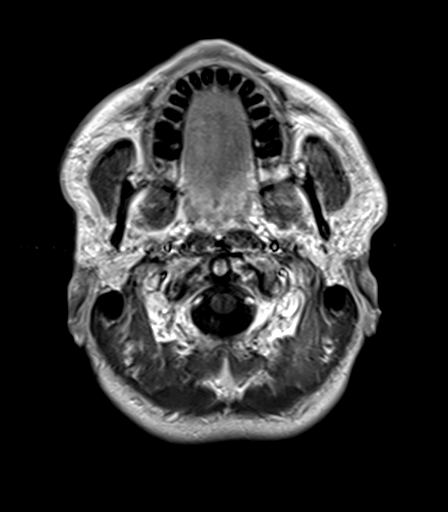
[im 9/26]
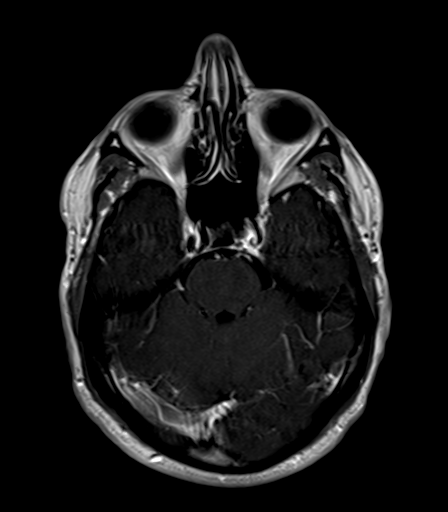
[im 17/26]
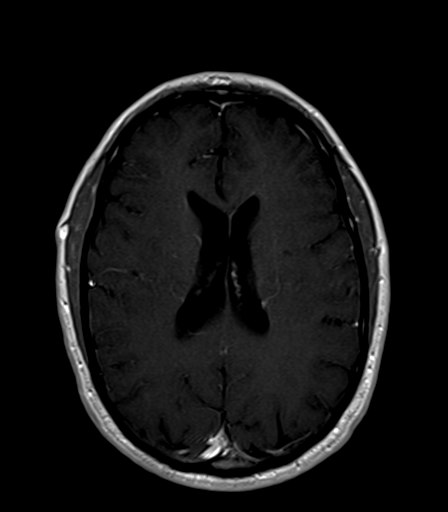

[30 of 48 positions shown; findings below may reference images not displayed]

FINDINGS: The fourth, third and lateral ventricles are normal in size and location.  There are round, small, less than 2 mm in diameter areas of bright signal with FLAIR and T2 on either side of the aqueduct, and there seems to be a third small, less than 2 mm in diameter area of bright signal with FLAIR and T2-weighting behind the aqueduct.  These may be normal periaqueductal structures because there are two of them and they are symmetrical at least laterally.  FLAIR image 13 series 6, T2-weighted axial image 13 series 4.  They do not enhance.  There are no other white matter lesions.  No other abnormalities are noted.  There is no evidence of meningeal enhancement that would suggest meningeal accumulation of leukemic cells.
IMPRESSION: Probably normal brain MRI.  There are normal structures in the periaqueductal brain tissue that sometimes show up like this but I would like to do one study in another 4 weeks using thin section technique through the brainstem.
